# Patient Record
Sex: Female | Born: 1960 | Race: White | Hispanic: No | Smoking: Current every day smoker
Health system: Southern US, Community
[De-identification: ages and names within clinical notes are randomized; demographics above are authoritative.]

## PROBLEM LIST (undated history)

## (undated) DIAGNOSIS — M1990 Unspecified osteoarthritis, unspecified site: Secondary | ICD-10-CM

## (undated) DIAGNOSIS — Z973 Presence of spectacles and contact lenses: Secondary | ICD-10-CM

## (undated) DIAGNOSIS — I10 Essential (primary) hypertension: Secondary | ICD-10-CM

## (undated) DIAGNOSIS — F419 Anxiety disorder, unspecified: Secondary | ICD-10-CM

## (undated) DIAGNOSIS — F32A Depression, unspecified: Secondary | ICD-10-CM

## (undated) DIAGNOSIS — R112 Nausea with vomiting, unspecified: Secondary | ICD-10-CM

## (undated) DIAGNOSIS — Z720 Tobacco use: Secondary | ICD-10-CM

## (undated) DIAGNOSIS — Z800 Family history of malignant neoplasm of digestive organs: Secondary | ICD-10-CM

## (undated) DIAGNOSIS — K589 Irritable bowel syndrome without diarrhea: Secondary | ICD-10-CM

## (undated) DIAGNOSIS — R928 Other abnormal and inconclusive findings on diagnostic imaging of breast: Secondary | ICD-10-CM

## (undated) DIAGNOSIS — F411 Generalized anxiety disorder: Secondary | ICD-10-CM

## (undated) DIAGNOSIS — Z803 Family history of malignant neoplasm of breast: Secondary | ICD-10-CM

## (undated) DIAGNOSIS — R002 Palpitations: Secondary | ICD-10-CM

## (undated) DIAGNOSIS — F329 Major depressive disorder, single episode, unspecified: Secondary | ICD-10-CM

## (undated) DIAGNOSIS — N209 Urinary calculus, unspecified: Secondary | ICD-10-CM

## (undated) DIAGNOSIS — Z9889 Other specified postprocedural states: Secondary | ICD-10-CM

## (undated) DIAGNOSIS — F410 Panic disorder [episodic paroxysmal anxiety] without agoraphobia: Secondary | ICD-10-CM

## (undated) DIAGNOSIS — N189 Chronic kidney disease, unspecified: Secondary | ICD-10-CM

## (undated) HISTORY — PX: BREAST BIOPSY: SHX20

## (undated) HISTORY — DX: Depression, unspecified: F32.A

## (undated) HISTORY — DX: Other abnormal and inconclusive findings on diagnostic imaging of breast: R92.8

## (undated) HISTORY — DX: Family history of malignant neoplasm of digestive organs: Z80.0

## (undated) HISTORY — DX: Major depressive disorder, single episode, unspecified: F32.9

## (undated) HISTORY — DX: Tobacco use: Z72.0

## (undated) HISTORY — DX: Urinary calculus, unspecified: N20.9

## (undated) HISTORY — DX: Irritable bowel syndrome without diarrhea: K58.9

## (undated) HISTORY — DX: Family history of malignant neoplasm of breast: Z80.3

## (undated) HISTORY — DX: Anxiety disorder, unspecified: F41.9

## (undated) HISTORY — PX: LAPAROSCOPIC TUBAL LIGATION: SUR803

## (undated) HISTORY — DX: Essential (primary) hypertension: I10

## (undated) HISTORY — DX: Chronic kidney disease, unspecified: N18.9

## (undated) HISTORY — DX: Unspecified osteoarthritis, unspecified site: M19.90

## (undated) HISTORY — DX: Hypercalcemia: E83.52

---

## 2002-03-05 DIAGNOSIS — K589 Irritable bowel syndrome without diarrhea: Secondary | ICD-10-CM

## 2002-03-05 HISTORY — DX: Irritable bowel syndrome, unspecified: K58.9

## 2002-03-05 HISTORY — DX: Hypercalcemia: E83.52

## 2003-11-27 ENCOUNTER — Other Ambulatory Visit: Payer: Self-pay

## 2004-03-05 HISTORY — PX: CHOLECYSTECTOMY: SHX55

## 2004-03-16 ENCOUNTER — Ambulatory Visit: Payer: Self-pay | Admitting: Internal Medicine

## 2004-08-21 ENCOUNTER — Ambulatory Visit: Payer: Self-pay | Admitting: Unknown Physician Specialty

## 2005-09-13 ENCOUNTER — Ambulatory Visit: Payer: Self-pay | Admitting: Unknown Physician Specialty

## 2005-09-20 ENCOUNTER — Ambulatory Visit: Payer: Self-pay

## 2005-12-03 ENCOUNTER — Ambulatory Visit: Payer: Self-pay | Admitting: Internal Medicine

## 2005-12-27 ENCOUNTER — Ambulatory Visit: Payer: Self-pay | Admitting: General Surgery

## 2006-02-11 ENCOUNTER — Ambulatory Visit: Payer: Self-pay | Admitting: Urology

## 2006-09-02 ENCOUNTER — Ambulatory Visit: Payer: Self-pay | Admitting: Urology

## 2006-10-10 ENCOUNTER — Ambulatory Visit: Payer: Self-pay | Admitting: Gastroenterology

## 2006-10-22 ENCOUNTER — Ambulatory Visit: Payer: Self-pay | Admitting: Unknown Physician Specialty

## 2006-10-28 ENCOUNTER — Ambulatory Visit: Payer: Self-pay | Admitting: Unknown Physician Specialty

## 2006-11-19 ENCOUNTER — Ambulatory Visit: Payer: Self-pay | Admitting: General Surgery

## 2007-08-25 ENCOUNTER — Ambulatory Visit: Payer: Self-pay | Admitting: Urology

## 2007-11-11 ENCOUNTER — Ambulatory Visit: Payer: Self-pay | Admitting: Unknown Physician Specialty

## 2008-03-05 HISTORY — PX: BILATERAL SALPINGOOPHORECTOMY: SHX1223

## 2008-06-17 ENCOUNTER — Emergency Department: Payer: Self-pay | Admitting: Unknown Physician Specialty

## 2008-08-25 ENCOUNTER — Ambulatory Visit: Payer: Self-pay | Admitting: Urology

## 2008-10-13 ENCOUNTER — Ambulatory Visit: Payer: Self-pay | Admitting: Internal Medicine

## 2008-11-15 ENCOUNTER — Ambulatory Visit: Payer: Self-pay | Admitting: Unknown Physician Specialty

## 2008-11-23 ENCOUNTER — Ambulatory Visit: Payer: Self-pay | Admitting: Unknown Physician Specialty

## 2008-11-23 HISTORY — PX: LAPAROSCOPIC SUPRACERVICAL HYSTERECTOMY: SUR797

## 2008-12-16 ENCOUNTER — Ambulatory Visit: Payer: Self-pay | Admitting: Unknown Physician Specialty

## 2009-01-07 ENCOUNTER — Ambulatory Visit: Payer: Self-pay | Admitting: Internal Medicine

## 2009-12-27 ENCOUNTER — Ambulatory Visit: Payer: Self-pay | Admitting: Unknown Physician Specialty

## 2011-03-01 ENCOUNTER — Ambulatory Visit: Payer: Self-pay | Admitting: Unknown Physician Specialty

## 2011-03-06 HISTORY — PX: COLPOSCOPY: SHX161

## 2012-03-06 ENCOUNTER — Ambulatory Visit: Payer: Self-pay | Admitting: Family Medicine

## 2012-03-17 ENCOUNTER — Ambulatory Visit: Payer: Self-pay | Admitting: Family Medicine

## 2012-09-18 ENCOUNTER — Encounter: Payer: Self-pay | Admitting: *Deleted

## 2012-09-29 ENCOUNTER — Ambulatory Visit: Payer: Self-pay | Admitting: General Surgery

## 2013-07-02 ENCOUNTER — Ambulatory Visit: Payer: Self-pay | Admitting: Family Medicine

## 2013-07-08 ENCOUNTER — Ambulatory Visit: Payer: Self-pay | Admitting: Family Medicine

## 2013-08-17 ENCOUNTER — Ambulatory Visit: Payer: Self-pay | Admitting: Gastroenterology

## 2013-08-17 HISTORY — PX: COLONOSCOPY: SHX174

## 2014-01-11 ENCOUNTER — Ambulatory Visit: Payer: Self-pay | Admitting: Nurse Practitioner

## 2015-07-11 ENCOUNTER — Other Ambulatory Visit: Payer: Self-pay | Admitting: Nurse Practitioner

## 2015-07-11 DIAGNOSIS — R1312 Dysphagia, oropharyngeal phase: Secondary | ICD-10-CM

## 2015-07-15 ENCOUNTER — Ambulatory Visit
Admission: RE | Admit: 2015-07-15 | Discharge: 2015-07-15 | Disposition: A | Discharge status: Home / Self Care | Payer: 59 | Source: Ambulatory Visit | Attending: Nurse Practitioner | Admitting: Nurse Practitioner

## 2015-07-15 DIAGNOSIS — R1312 Dysphagia, oropharyngeal phase: Secondary | ICD-10-CM | POA: Insufficient documentation

## 2015-07-15 MED ORDER — IOPAMIDOL (ISOVUE-300) INJECTION 61%
75.0000 mL | Freq: Once | INTRAVENOUS | Status: AC | PRN
  Administered 2015-07-15: 75 mL via INTRAVENOUS

## 2015-07-25 ENCOUNTER — Other Ambulatory Visit: Payer: Self-pay | Admitting: Internal Medicine

## 2015-07-25 DIAGNOSIS — Z1231 Encounter for screening mammogram for malignant neoplasm of breast: Secondary | ICD-10-CM

## 2015-08-15 ENCOUNTER — Ambulatory Visit: Payer: 59

## 2015-09-07 ENCOUNTER — Other Ambulatory Visit: Payer: Self-pay | Admitting: Internal Medicine

## 2015-09-07 ENCOUNTER — Ambulatory Visit
Admission: RE | Admit: 2015-09-07 | Discharge: 2015-09-07 | Disposition: A | Discharge status: Home / Self Care | Payer: 59 | Source: Ambulatory Visit | Attending: Internal Medicine | Admitting: Internal Medicine

## 2015-09-07 DIAGNOSIS — Z1231 Encounter for screening mammogram for malignant neoplasm of breast: Secondary | ICD-10-CM | POA: Insufficient documentation

## 2016-05-29 ENCOUNTER — Ambulatory Visit (INDEPENDENT_AMBULATORY_CARE_PROVIDER_SITE_OTHER): Payer: 59 | Admitting: Certified Nurse Midwife

## 2016-05-29 ENCOUNTER — Encounter: Payer: Self-pay | Admitting: Certified Nurse Midwife

## 2016-05-29 VITALS — BP 102/62 | HR 65 | Ht 68.5 in | Wt 208.0 lb

## 2016-05-29 DIAGNOSIS — Z124 Encounter for screening for malignant neoplasm of cervix: Secondary | ICD-10-CM

## 2016-05-29 DIAGNOSIS — I10 Essential (primary) hypertension: Secondary | ICD-10-CM | POA: Insufficient documentation

## 2016-05-29 DIAGNOSIS — Z01419 Encounter for gynecological examination (general) (routine) without abnormal findings: Secondary | ICD-10-CM

## 2016-05-29 DIAGNOSIS — Z720 Tobacco use: Secondary | ICD-10-CM

## 2016-05-29 DIAGNOSIS — Z803 Family history of malignant neoplasm of breast: Secondary | ICD-10-CM | POA: Insufficient documentation

## 2016-05-29 DIAGNOSIS — Z800 Family history of malignant neoplasm of digestive organs: Secondary | ICD-10-CM | POA: Diagnosis not present

## 2016-05-29 DIAGNOSIS — F329 Major depressive disorder, single episode, unspecified: Secondary | ICD-10-CM | POA: Insufficient documentation

## 2016-05-29 DIAGNOSIS — F32A Depression, unspecified: Secondary | ICD-10-CM | POA: Insufficient documentation

## 2016-05-29 NOTE — Progress Notes (Signed)
Gynecology Annual Exam  PCP: Dr Terese Door Chief Complaint:  Chief Complaint  Patient presents with  . Gynecologic Exam    History of Present Illness: Patient is a 56 y.o. G2P2002 presents for annual exam. The patient has no gyn complaints today.  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 She has had no spotting. Hot flashes occasionally during the day usually with increased stress. Past Medical history is significant for hypertension, anxiety, depression, tonsillar stones, urinary stones, and a left breast biopsy (2015-benign). Since her last annual exam in2/23/2016, she has had no significant changes in her health. Last pap smear was 04/27/2014 and was NIL/neg. She had a ASCUS PAP with positive HRHPV Pap  in 2013. Colpo directed biopsies done-HPV effect. Last mammogram was 09/08/2015 and was negative. She has a family history of breast cancer in her MGM, mother, and paternal GM. Genetic testing has not been done. There is a family history of colon cancer in her maternal aunt. Genetic testing has not been done. She has had several colonoscopies. Her last was 08/17/2013 by Dr Allen Norris. Several hyperplastic polyps were removed. Was advised to repeat colonoscopy in 3 years.   She does smoke 1 PPD of cigarettes. Started smoking at age 67. Has stopped twice cold [place] x 2 years each time. Is not ready to quit. She drinks alcohol infrequently She does not exercise, other than cleaning. She does get adequate calcium in her diet. Her last lipid panel was done in 2017 by her PCP and it was abnormal, although improved from the previous year.     Review of Systems: Review of Systems  Constitutional: Negative for chills, fever and weight loss.  HENT: Positive for congestion, ear pain, sinus pain and sore throat.   Eyes: Negative for blurred vision and pain.  Respiratory: Negative for hemoptysis, shortness of breath and wheezing.   Cardiovascular: Positive for  palpitations. Negative for chest pain and leg swelling.  Gastrointestinal: Negative for abdominal pain, blood in stool, diarrhea, heartburn, nausea and vomiting.  Genitourinary: Negative for dysuria, frequency, hematuria and urgency.  Musculoskeletal: Positive for joint pain. Negative for back pain and myalgias.  Skin: Negative for itching and rash.  Neurological: Negative for dizziness, tingling and headaches.  Endo/Heme/Allergies: Negative for environmental allergies and polydipsia. Does not bruise/bleed easily.       Negative for hirsutism   Psychiatric/Behavioral: Negative for depression. The patient is nervous/anxious. The patient does not have insomnia.     Past Medical History:  Past Medical History:  Diagnosis Date  . Abnormal mammogram, unspecified   . Anxiety   . Arthritis   . Depression   . Family history of breast cancer   . Family history of colon cancer   . Hypercalcemia 2004  . Hypertension   . IBS (irritable bowel syndrome) 2004  . Tobacco abuse   . Urinary calculus     Past Surgical History:  Past Surgical History:  Procedure Laterality Date  . BILATERAL SALPINGOOPHORECTOMY  2010   bilateral mucinous cystadenomas/ Dr Rayford Halsted with [facility]  . BREAST BIOPSY Left    neg  . CERVIX SURGERY  2013   biopsy  . CHOLECYSTECTOMY  2006  . COLONOSCOPY  08/17/2013   small polyps  . COLPOSCOPY  2013  . LAPAROSCOPIC SUPRACERVICAL HYSTERECTOMY  2010  . LAPAROSCOPIC TUBAL LIGATION       Obstetric History: [id number]  Family History:  Family History  Problem Relation Age of Onset  .  Leukemia Cousin 3  . Breast cancer Paternal Grandmother 2  . Schizophrenia Paternal Grandmother   . Diabetes Paternal Grandmother   . Colon cancer Maternal Aunt 37  . Leukemia Maternal Grandfather 35  . Breast cancer Maternal Grandmother 80  . Breast cancer Mother 69  . Diabetes Sister   . Schizophrenia Son     Social History:  Social History   Social History  . Marital status:  Divorced    Spouse name: N/A  . Number of children: 2  . Years of education: N/A   Occupational History  . Not on file.   Social History Main Topics  . Smoking status: Current Every Day Smoker    Packs/day: 1.00    Years: 36.00    Types: Cigarettes    Start date: 41  . Smokeless tobacco: Never Used  . Alcohol use Yes     Comment: rare  . Drug use: No  . Sexual activity: No   Other Topics Concern  . Not on file   Social History Narrative  . No narrative on file    Allergies:  Allergies  Allergen Reactions  . Codeine Nausea And Vomiting    Medications: Prior to Admission medications   Medication Sig Start Date End Date Taking? Authorizing Provider  ALPRAZolam Duanne Moron) 0.5 MG tablet Take 0.5 mg by mouth at bedtime as needed for anxiety.   Yes Historical Provider, MD  atenolol (TENORMIN) 50 MG tablet Take 50 mg by mouth daily.   Yes Historical Provider, MD  hydrochlorothiazide (HYDRODIURIL) 25 MG tablet Take 25 mg by mouth daily.   Yes Historical Provider, MD  PARoxetine (PAXIL) 20 MG tablet Take 20 mg by mouth daily.   Yes Historical Provider, MD  She also takes Vitamin C, Vitamin D3 and Align  Physical Exam Vitals: Blood pressure 102/62, pulse 65, height 5' 8.5" (1.74 m), weight 94.3 kg (208 lb).  General: NAD. Pleasant, well nourished, WF HEENT: normocephalic, anicteric Thyroid: no enlargement, no palpable nodules Pulmonary: No increased work of breathing, CTAB Cardiovascular: RRR with Grade II/VI systolic murmur at aortic area Breast: Breast symmetrical, no tenderness, no palpable nodules or masses, no skin or nipple retraction present, no nipple discharge.  No axillary, infraclavicular,  or supraclavicular lymphadenopathy. Abdomen: soft, non-tender, non-distended.  No hepatomegaly or masses palpable. No evidence of hernia  Genitourinary:  External: Normal external female genitalia.  Normal urethral meatus, normal Bartholin's and Skene's glands.    Vagina:  Normal vaginal mucosa, no evidence of prolapse.    Cervix: Grossly normal in appearance, no bleeding  Uterus: surgically asent  Adnexa:  no adnexal masses, NT  Rectal: deferred  Lymphatic: no evidence of inguinal lymphadenopathy Extremities: no edema, erythema, or tenderness Neurologic: Grossly intact Psychiatric: mood appropriate, affect full     Assessment: 56 y.o. [id number] well woman exam.  History of abnormal Pap smears Family history of breast and colon cancer   Plan:   1) Mammogram  - recommend yearly screening mammogram- patient to schedule at [facility] after 09/07/2016 -offered MYRISK testing-declined  2) Cervical cancer screening-Pap smear done  3) Osteoporosis prevention- discussed calcium and vitamin D3 requirements/ Recommend vitamin D3 supplements/  4) Routine healthcare maintenance including cholesterol, diabetes screening per her PCP  5) Colonoscopy -due this year per Dr Dorothey Baseman notes. Offered MYRISK screen due to family history of colon cancer <50 yo. Patient declines  6) Follow up 1 year for routine annual   Dalia Heading, [place]

## 2016-06-01 LAB — IGP, APTIMA HPV
HPV Aptima: NEGATIVE
PAP SMEAR COMMENT: 0

## 2016-06-06 ENCOUNTER — Telehealth: Payer: Self-pay | Admitting: Gastroenterology

## 2016-06-06 NOTE — Telephone Encounter (Signed)
Patient thinks she was told to have a repeat colonoscopy in 3 years. Can you check and call her back please.

## 2016-06-06 NOTE — Telephone Encounter (Signed)
Left vm letting pt know her last colonoscopy was 08/17/13. Per Dr. Dorothey Baseman recommendation, due to hx of colon polyps, she is to repeat in 5 years. Next colon due 08/18/2018.

## 2016-07-31 ENCOUNTER — Other Ambulatory Visit: Payer: Self-pay | Admitting: Internal Medicine

## 2016-07-31 DIAGNOSIS — Z1231 Encounter for screening mammogram for malignant neoplasm of breast: Secondary | ICD-10-CM

## 2016-08-29 DIAGNOSIS — N200 Calculus of kidney: Secondary | ICD-10-CM | POA: Insufficient documentation

## 2016-08-29 DIAGNOSIS — R3129 Other microscopic hematuria: Secondary | ICD-10-CM | POA: Insufficient documentation

## 2016-08-29 DIAGNOSIS — R82994 Hypercalciuria: Secondary | ICD-10-CM | POA: Insufficient documentation

## 2016-08-29 DIAGNOSIS — N23 Unspecified renal colic: Secondary | ICD-10-CM | POA: Insufficient documentation

## 2016-09-04 ENCOUNTER — Ambulatory Visit: Payer: 59 | Admitting: Gastroenterology

## 2016-09-04 ENCOUNTER — Other Ambulatory Visit: Payer: Self-pay

## 2016-09-10 ENCOUNTER — Ambulatory Visit
Admission: RE | Admit: 2016-09-10 | Discharge: 2016-09-10 | Disposition: A | Discharge status: Home / Self Care | Payer: 59 | Source: Ambulatory Visit | Attending: Internal Medicine | Admitting: Internal Medicine

## 2016-09-10 DIAGNOSIS — Z1231 Encounter for screening mammogram for malignant neoplasm of breast: Secondary | ICD-10-CM

## 2016-11-06 ENCOUNTER — Ambulatory Visit: Payer: 59 | Admitting: Gastroenterology

## 2016-12-04 ENCOUNTER — Ambulatory Visit (INDEPENDENT_AMBULATORY_CARE_PROVIDER_SITE_OTHER): Payer: 59 | Admitting: Gastroenterology

## 2016-12-04 ENCOUNTER — Encounter: Payer: Self-pay | Admitting: Gastroenterology

## 2016-12-04 VITALS — BP 123/85 | HR 74 | Temp 98.9°F | Ht 68.0 in | Wt 205.6 lb

## 2016-12-04 DIAGNOSIS — K580 Irritable bowel syndrome with diarrhea: Secondary | ICD-10-CM | POA: Diagnosis not present

## 2016-12-04 DIAGNOSIS — K529 Noninfective gastroenteritis and colitis, unspecified: Secondary | ICD-10-CM

## 2016-12-04 MED ORDER — AMITRIPTYLINE HCL 25 MG PO TABS: 25.0000 mg | ORAL_TABLET | Freq: Every day | ORAL | 1 refills | Status: DC

## 2016-12-04 NOTE — Progress Notes (Signed)
Shari Darby, MD [address]  [place]  [place], [place] [address]  Main: [phone]  Fax: [phone]    Gastroenterology Consultation  Referring Provider:     Perrin Maltese, MD Primary Care Physician:  Perrin Maltese, MD Primary Gastroenterologist:  Dr. Cephas Foster Reason for Consultation:   IBS        HPI:   Shari Foster is a 56 y.o. y/o female referred by Dr. Perrin Maltese, MD  for consultation & management of IBS Has long standing IBS, variable stool consistency on any given day, abdominal cramps, nausea, sometimes without BM for 2-3 days, but has cramps and gas.Tried miralax as needed which helps to have regular BM, align helped temporarily. She sometimes has episodes of nausea and vomiting  Also, has psychological disorders, was taking xanax prescribed by PCP but since it was stopped by her PCP her anxiety got worse and IBS symptoms are also worse. She says that she cannot afford to see a psychiatrist. In order to see a psychologist, she has to attend therapy sessions 2 weeks and she does not have 2-3 hours per week to attend sessions due to her work. She denies any rectal bleeding, hematemesis, melena, weight loss. Avoiding lactose products but still not helping  Mother's sister died from colon cancer in late 60s, early 1s age Mother died from breast cancer GI Procedures: last colonoscopy 5 years ago,   Past Medical History:  Diagnosis Date  . Abnormal mammogram, unspecified   . Anxiety   . Arthritis   . Depression   . Family history of breast cancer   . Family history of colon cancer   . Hypercalcemia 2004  . Hypertension   . IBS (irritable bowel syndrome) 2004  . Tobacco abuse   . Urinary calculus     Past Surgical History:  Procedure Laterality Date  . BILATERAL SALPINGOOPHORECTOMY  2010   bilateral mucinous cystadenomas/ Dr Rayford Halsted with [facility]  . BREAST BIOPSY Left    neg  . CERVIX SURGERY  2013   biopsy  . CHOLECYSTECTOMY  2006  .  COLONOSCOPY  08/17/2013   small polyps  . COLPOSCOPY  2013  . LAPAROSCOPIC SUPRACERVICAL HYSTERECTOMY  2010  . LAPAROSCOPIC TUBAL LIGATION      Prior to Admission medications   Medication Sig Start Date End Date Taking? Authorizing Provider  ALPRAZolam Duanne Moron) 0.5 MG tablet Take 0.5 mg by mouth at bedtime as needed for anxiety.    [provider]  Ascorbic Acid (VITAMIN C) 1000 MG tablet Take 1,000 mg by mouth daily.    [provider]  atenolol (TENORMIN) 50 MG tablet Take 50 mg by mouth daily.    [provider]  Cholecalciferol 1000 units capsule Take 1,000 Units by mouth daily.    [provider]  hydrochlorothiazide (HYDRODIURIL) 25 MG tablet Take 25 mg by mouth daily.    [provider]  PARoxetine (PAXIL) 20 MG tablet Take 20 mg by mouth daily.    [provider]  Probiotic Product (PROBIOTIC-10) CAPS Take 1 capsule by mouth daily.    [provider]    Family History  Problem Relation Age of Onset  . Leukemia Cousin 63  . Breast cancer Paternal Grandmother 25  . Schizophrenia Paternal Grandmother   . Diabetes Paternal Grandmother   . Colon cancer Maternal Aunt 24  . Leukemia Maternal Grandfather 57  . Breast cancer Maternal Grandmother 80  . Breast cancer Mother  34  . Diabetes Sister   . Schizophrenia Son      Social History  Substance Use Topics  . Smoking status: Current Every Day Smoker    Packs/day: 1.00    Years: 36.00    Types: Cigarettes    Start date: 75  . Smokeless tobacco: Never Used  . Alcohol use No     Comment: rare    Allergies as of 12/04/2016 - Review Complete 12/04/2016  Allergen Reaction Noted  . Codeine Nausea And Vomiting 05/29/2016    Review of Systems:    All systems reviewed and negative except where noted in HPI.   Physical Exam:  BP 123/85   Pulse 74   Temp 98.9 F (37.2 C)   Ht 5\' 8"  (1.727 m)   Wt 205 lb 9.6 oz (93.3 kg)   BMI 31.26 kg/m  No LMP recorded.  Patient has had a hysterectomy.  General:   Alert,  Well-developed, well-nourished, pleasant and cooperative in NAD Head:  Normocephalic and atraumatic. Eyes:  Sclera clear, no icterus.   Conjunctiva pink. Ears:  Normal auditory acuity. Nose:  No deformity, discharge, or lesions. Mouth:  No deformity or lesions,oropharynx pink & moist. Neck:  Supple; no masses or thyromegaly. Lungs:  Respirations even and unlabored.  Clear throughout to auscultation.   No wheezes, crackles, or rhonchi. No acute distress. Heart:  Regular rate and rhythm; no murmurs, clicks, rubs, or gallops. Abdomen:  Normal bowel sounds.  No bruits.  Soft, mild lower abdominal tenderness and non-distended without masses, hepatosplenomegaly or hernias noted.  No guarding or rebound tenderness.   Rectal: Nor performed Msk:  Symmetrical without gross deformities. Good, equal movement & strength bilaterally. Pulses:  Normal pulses noted. Extremities:  No clubbing or edema.  No cyanosis. Neurologic:  Alert and oriented x3;  grossly normal neurologically. Skin:  Intact without significant lesions or rashes. No jaundice. Psych:  Alert and cooperative. Normal mood and affect.  Imaging Studies: No abdominal imaging available  Assessment and Plan:   Shari Foster is a 56 y.o. female with Anxiety, IBS-D presents with 1 month exacerbation of IBS-D in the setting of poorly controlled anxiety disorder. Trial of amitriptyline 25 mg at bedtime, will give samples for IB-guard, low FODMAPs diet. She did not tolerate desipramine in the past when it was given to treat her depression Colonoscopy given family history of colon cancer Check routine labs Stool for C. Difficile  Follow up in 4 weeks   Shari Darby, MD

## 2016-12-06 ENCOUNTER — Other Ambulatory Visit: Payer: Self-pay

## 2016-12-06 DIAGNOSIS — Z800 Family history of malignant neoplasm of digestive organs: Secondary | ICD-10-CM

## 2016-12-18 ENCOUNTER — Encounter: Payer: Self-pay | Admitting: *Deleted

## 2016-12-24 NOTE — Discharge Instructions (Signed)
General Anesthesia, Adult, Care After °These instructions provide you with information about caring for yourself after your procedure. Your health care provider may also give you more specific instructions. Your treatment has been planned according to current medical practices, but problems sometimes occur. Call your health care provider if you have any problems or questions after your procedure. °What can I expect after the procedure? °After the procedure, it is common to have: °· Vomiting. °· A sore throat. °· Mental slowness. ° °It is common to feel: °· Nauseous. °· Cold or shivery. °· Sleepy. °· Tired. °· Sore or achy, even in parts of your body where you did not have surgery. ° °Follow these instructions at home: °For at least 24 hours after the procedure: °· Do not: °? Participate in activities where you could fall or become injured. °? Drive. °? Use heavy machinery. °? Drink alcohol. °? Take sleeping pills or medicines that cause drowsiness. °? Make important decisions or sign legal documents. °? Take care of children on your own. °· Rest. °Eating and drinking °· If you vomit, drink water, juice, or soup when you can drink without vomiting. °· Drink enough fluid to keep your urine clear or pale yellow. °· Make sure you have little or no nausea before eating solid foods. °· Follow the diet recommended by your health care provider. °General instructions °· Have a responsible adult stay with you until you are awake and alert. °· Return to your normal activities as told by your health care provider. Ask your health care provider what activities are safe for you. °· Take over-the-counter and prescription medicines only as told by your health care provider. °· If you smoke, do not smoke without supervision. °· Keep all follow-up visits as told by your health care provider. This is important. °Contact a health care provider if: °· You continue to have nausea or vomiting at home, and medicines are not helpful. °· You  cannot drink fluids or start eating again. °· You cannot urinate after 8-12 hours. °· You develop a skin rash. °· You have fever. °· You have increasing redness at the site of your procedure. °Get help right away if: °· You have difficulty breathing. °· You have chest pain. °· You have unexpected bleeding. °· You feel that you are having a life-threatening or urgent problem. °This information is not intended to replace advice given to you by your health care provider. Make sure you discuss any questions you have with your health care provider. °Document Released: 05/28/2000 Document Revised: 07/25/2015 Document Reviewed: 02/03/2015 °Elsevier Interactive Patient Education © 2018 [employer] Inc[employer] ° °

## 2016-12-26 ENCOUNTER — Ambulatory Visit
Admission: RE | Admit: 2016-12-26 | Discharge: 2016-12-26 | Disposition: A | Discharge status: Home / Self Care | Payer: 59 | Source: Ambulatory Visit | Attending: Gastroenterology | Admitting: Gastroenterology

## 2016-12-26 ENCOUNTER — Encounter
Admission: RE | Disposition: A | Discharge status: Home / Self Care | Payer: Self-pay | Source: Ambulatory Visit | Attending: Gastroenterology

## 2016-12-26 ENCOUNTER — Ambulatory Visit: Payer: 59 | Admitting: Anesthesiology

## 2016-12-26 DIAGNOSIS — K635 Polyp of colon: Secondary | ICD-10-CM | POA: Diagnosis not present

## 2016-12-26 DIAGNOSIS — Z888 Allergy status to other drugs, medicaments and biological substances status: Secondary | ICD-10-CM | POA: Diagnosis not present

## 2016-12-26 DIAGNOSIS — K648 Other hemorrhoids: Secondary | ICD-10-CM | POA: Diagnosis not present

## 2016-12-26 DIAGNOSIS — K589 Irritable bowel syndrome without diarrhea: Secondary | ICD-10-CM | POA: Insufficient documentation

## 2016-12-26 DIAGNOSIS — Z79899 Other long term (current) drug therapy: Secondary | ICD-10-CM | POA: Diagnosis not present

## 2016-12-26 DIAGNOSIS — M1990 Unspecified osteoarthritis, unspecified site: Secondary | ICD-10-CM | POA: Insufficient documentation

## 2016-12-26 DIAGNOSIS — F419 Anxiety disorder, unspecified: Secondary | ICD-10-CM | POA: Insufficient documentation

## 2016-12-26 DIAGNOSIS — F329 Major depressive disorder, single episode, unspecified: Secondary | ICD-10-CM | POA: Diagnosis not present

## 2016-12-26 DIAGNOSIS — Z1211 Encounter for screening for malignant neoplasm of colon: Secondary | ICD-10-CM | POA: Diagnosis not present

## 2016-12-26 DIAGNOSIS — I10 Essential (primary) hypertension: Secondary | ICD-10-CM | POA: Diagnosis not present

## 2016-12-26 DIAGNOSIS — F17210 Nicotine dependence, cigarettes, uncomplicated: Secondary | ICD-10-CM | POA: Diagnosis not present

## 2016-12-26 DIAGNOSIS — D125 Benign neoplasm of sigmoid colon: Secondary | ICD-10-CM

## 2016-12-26 DIAGNOSIS — Z9189 Other specified personal risk factors, not elsewhere classified: Secondary | ICD-10-CM

## 2016-12-26 DIAGNOSIS — D122 Benign neoplasm of ascending colon: Secondary | ICD-10-CM

## 2016-12-26 DIAGNOSIS — D120 Benign neoplasm of cecum: Secondary | ICD-10-CM

## 2016-12-26 HISTORY — PX: COLONOSCOPY WITH PROPOFOL: SHX5780

## 2016-12-26 HISTORY — DX: Other specified postprocedural states: Z98.890

## 2016-12-26 HISTORY — DX: Presence of spectacles and contact lenses: Z97.3

## 2016-12-26 HISTORY — DX: Palpitations: R00.2

## 2016-12-26 HISTORY — DX: Nausea with vomiting, unspecified: R11.2

## 2016-12-26 SURGERY — COLONOSCOPY WITH PROPOFOL
Anesthesia: General | Wound class: Clean Contaminated

## 2016-12-26 MED ORDER — ACETAMINOPHEN 160 MG/5ML PO SOLN: 325.0000 mg | Freq: Once | ORAL | Status: AC

## 2016-12-26 MED ORDER — PROPOFOL 10 MG/ML IV BOLUS
INTRAVENOUS | Status: DC | PRN
  Administered 2016-12-26: 50 mg via INTRAVENOUS
  Administered 2016-12-26: 100 mg via INTRAVENOUS

## 2016-12-26 MED ORDER — ACETAMINOPHEN 325 MG PO TABS
325.0000 mg | ORAL_TABLET | Freq: Once | ORAL | Status: AC
  Administered 2016-12-26: 325 mg via ORAL

## 2016-12-26 MED ORDER — PROPOFOL 500 MG/50ML IV EMUL
INTRAVENOUS | Status: DC | PRN
  Administered 2016-12-26: 150 ug/kg/min via INTRAVENOUS

## 2016-12-26 MED ORDER — LACTATED RINGERS IV SOLN
INTRAVENOUS | Status: DC
  Administered 2016-12-26: 09:00:00 via INTRAVENOUS

## 2016-12-26 SURGICAL SUPPLY — 23 items
CANISTER SUCT 1200ML W/VALVE (MISCELLANEOUS) ×3 IMPLANT
CLIP HMST 235XBRD CATH ROT (MISCELLANEOUS) IMPLANT
CLIP RESOLUTION 360 11X235 (MISCELLANEOUS)
FCP ESCP3.2XJMB 240X2.8X (MISCELLANEOUS)
FORCEPS BIOP RAD 4 LRG CAP 4 (CUTTING FORCEPS) ×3 IMPLANT
FORCEPS BIOP RJ4 240 W/NDL (MISCELLANEOUS)
FORCEPS ESCP3.2XJMB 240X2.8X (MISCELLANEOUS) IMPLANT
GOWN CVR UNV OPN BCK APRN NK (MISCELLANEOUS) ×2 IMPLANT
GOWN ISOL THUMB LOOP REG UNIV (MISCELLANEOUS) ×4
INJECTOR VARIJECT VIN23 (MISCELLANEOUS) IMPLANT
KIT DEFENDO VALVE AND CONN (KITS) IMPLANT
KIT ENDO PROCEDURE OLY (KITS) ×3 IMPLANT
MARKER SPOT ENDO TATTOO 5ML (MISCELLANEOUS) IMPLANT
PAD GROUND ADULT SPLIT (MISCELLANEOUS) IMPLANT
PROBE APC STR FIRE (PROBE) IMPLANT
RETRIEVER NET ROTH 2.5X230 LF (MISCELLANEOUS) IMPLANT
SNARE SHORT THROW 13M SML OVAL (MISCELLANEOUS) ×3 IMPLANT
SNARE SHORT THROW 30M LRG OVAL (MISCELLANEOUS) IMPLANT
SNARE SNG USE RND 15MM (INSTRUMENTS) IMPLANT
SPOT EX ENDOSCOPIC TATTOO (MISCELLANEOUS)
TRAP ETRAP POLY (MISCELLANEOUS) ×3 IMPLANT
VARIJECT INJECTOR VIN23 (MISCELLANEOUS)
WATER STERILE IRR 250ML POUR (IV SOLUTION) ×3 IMPLANT

## 2016-12-26 NOTE — H&P (Signed)
Cephas Darby, MD [address]  [place], [place] [address]  Main: [phone]  Fax: [phone] Pager: [phone]  Primary Care Physician:  Patient, No Pcp Per Primary Gastroenterologist:  Dr. Cephas Darby  Pre-Procedure History & Physical: HPI:  Shari Foster is a 56 y.o. female is here for an colonoscopy.   Past Medical History:  Diagnosis Date  . Abnormal mammogram, unspecified   . Anxiety   . Arthritis   . Depression   . Family history of breast cancer   . Family history of colon cancer   . Hypercalcemia 2004  . Hypertension    (pt denies)  . IBS (irritable bowel syndrome) 2004  . Palpitations   . PONV (postoperative nausea and vomiting)   . Tobacco abuse   . Urinary calculus   . Wears contact lenses     Past Surgical History:  Procedure Laterality Date  . BILATERAL SALPINGOOPHORECTOMY  2010   bilateral mucinous cystadenomas/ Dr Rayford Halsted with [facility]  . BREAST BIOPSY Left    neg  . CERVIX SURGERY  2013   biopsy  . CHOLECYSTECTOMY  2006  . COLONOSCOPY  08/17/2013   small polyps  . COLPOSCOPY  2013  . LAPAROSCOPIC SUPRACERVICAL HYSTERECTOMY  2010  . LAPAROSCOPIC TUBAL LIGATION      Prior to Admission medications   Medication Sig Start Date End Date Taking? Authorizing Provider  atenolol (TENORMIN) 50 MG tablet Take 50 mg by mouth daily.   Yes [provider]  hydrochlorothiazide (HYDRODIURIL) 25 MG tablet Take 25 mg by mouth daily.   Yes [provider]  PARoxetine (PAXIL) 20 MG tablet Take 20 mg by mouth daily.   Yes [provider]  amitriptyline (ELAVIL) 25 MG tablet Take 1 tablet (25 mg total) by mouth at bedtime. Patient not taking: Reported on 12/26/2016 12/04/16 01/03/17  Lin Landsman, MD    Allergies as of 12/06/2016 - Review Complete 12/04/2016  Allergen Reaction Noted  . Codeine Nausea And Vomiting 05/29/2016    Family History  Problem Relation Age of Onset  . Leukemia Cousin 84  . Breast  cancer Paternal Grandmother 52  . Schizophrenia Paternal Grandmother   . Diabetes Paternal Grandmother   . Colon cancer Maternal Aunt 84  . Leukemia Maternal Grandfather 37  . Breast cancer Maternal Grandmother 80  . Breast cancer Mother 8  . Diabetes Sister   . Schizophrenia Son     Social History   Social History  . Marital status: Divorced    Spouse name: N/A  . Number of children: 2  . Years of education: N/A   Occupational History  . Not on file.   Social History Main Topics  . Smoking status: Current Every Day Smoker    Packs/day: 1.00    Years: 36.00    Types: Cigarettes    Start date: 50  . Smokeless tobacco: Never Used     Comment: off and on since age 19  . Alcohol use No     Comment: rare  . Drug use: No  . Sexual activity: No   Other Topics Concern  . Not on file   Social History Narrative  . No narrative on file    Review of Systems: See HPI, otherwise negative ROS  Physical Exam: BP 103/70   Pulse 89   Resp 16   Ht 5\' 8"  (1.727 m)   Wt 202 lb (91.6 kg)   SpO2 94%   BMI 30.71  kg/m  General:   Alert,  pleasant and cooperative in NAD Head:  Normocephalic and atraumatic. Neck:  Supple; no masses or thyromegaly. Lungs:  Clear throughout to auscultation.    Heart:  Regular rate and rhythm. Abdomen:  Soft, nontender and nondistended. Normal bowel sounds, without guarding, and without rebound.   Neurologic:  Alert and  oriented x4;  grossly normal neurologically.  Impression/Plan: Shari Foster is here for an colonoscopy to be performed for colon cancer surveillance  Risks, benefits, limitations, and alternatives regarding  colonoscopy have been reviewed with the patient.  Questions have been answered.  All parties agreeable.   Sherri Sear, MD  12/26/2016, 8:36 AM

## 2016-12-26 NOTE — Anesthesia Procedure Notes (Signed)
Date/Time: 12/26/2016 9:49 AM Performed by: Cameron Ali Pre-anesthesia Checklist: Patient identified, Emergency Drugs available, Suction available, Timeout performed and Patient being monitored Patient Re-evaluated:Patient Re-evaluated prior to induction Oxygen Delivery Method: Nasal cannula Placement Confirmation: positive ETCO2

## 2016-12-26 NOTE — Anesthesia Postprocedure Evaluation (Signed)
Anesthesia Post Note  Patient: Shari Foster  Procedure(s) Performed: COLONOSCOPY WITH PROPOFOL (N/A )  Patient location during evaluation: PACU Anesthesia Type: General Level of consciousness: awake and alert and oriented Pain management: satisfactory to patient Vital Signs Assessment: post-procedure vital signs reviewed and stable Respiratory status: spontaneous breathing, nonlabored ventilation and respiratory function stable Cardiovascular status: blood pressure returned to baseline and stable Postop Assessment: Adequate PO intake and No signs of nausea or vomiting Anesthetic complications: no    Raliegh Ip

## 2016-12-26 NOTE — Anesthesia Preprocedure Evaluation (Signed)
Anesthesia Evaluation  Patient identified by MRN, date of birth, ID band Patient awake    Reviewed: Allergy & Precautions, H&P , NPO status , Patient's Chart, lab work & pertinent test results  Airway Mallampati: II  TM Distance: >3 FB Neck ROM: full    Dental no notable dental hx.    Pulmonary Current Smoker,    Pulmonary exam normal breath sounds clear to auscultation       Cardiovascular hypertension, Normal cardiovascular exam Rhythm:regular Rate:Normal     Neuro/Psych PSYCHIATRIC DISORDERS    GI/Hepatic   Endo/Other    Renal/GU      Musculoskeletal   Abdominal   Peds  Hematology   Anesthesia Other Findings   Reproductive/Obstetrics                             Anesthesia Physical Anesthesia Plan  ASA: II  Anesthesia Plan: General   Post-op Pain Management:    Induction:   PONV Risk Score and Plan: 3 and Propofol infusion  Airway Management Planned:   Additional Equipment:   Intra-op Plan:   Post-operative Plan:   Informed Consent: I have reviewed the patients History and Physical, chart, labs and discussed the procedure including the risks, benefits and alternatives for the proposed anesthesia with the patient or authorized representative who has indicated his/her understanding and acceptance.     Plan Discussed with: CRNA  Anesthesia Plan Comments:         Anesthesia Quick Evaluation

## 2016-12-26 NOTE — Transfer of Care (Signed)
Immediate Anesthesia Transfer of Care Note  Patient: Shari Foster  Procedure(s) Performed: COLONOSCOPY WITH PROPOFOL (N/A )  Patient Location: PACU  Anesthesia Type: General  Level of Consciousness: awake, alert  and patient cooperative  Airway and Oxygen Therapy: Patient Spontanous Breathing and Patient connected to supplemental oxygen  Post-op Assessment: Post-op Vital signs reviewed, Patient's Cardiovascular Status Stable, Respiratory Function Stable, Patent Airway and No signs of Nausea or vomiting  Post-op Vital Signs: Reviewed and stable  Complications: No apparent anesthesia complications

## 2016-12-26 NOTE — Op Note (Signed)
[facility] Gastroenterology Patient Name: Shari Foster Procedure Date: 12/26/2016 9:43 AM MRN: [id number] Account #: [id number] Date of Birth: [birth year] Admit Type: Outpatient Age: 56 Room: [facility] OR ROOM 01 Gender: Female Note Status: Finalized Procedure:            Colonoscopy Indications:          Screening in patient at increased risk: Colorectal                        cancer in sister before age 20, Last colonoscopy: June                        2015 Providers:            Lin Landsman MD, MD Referring MD:         Perrin Maltese, MD (Referring MD) Medicines:            Monitored Anesthesia Care Complications:        No immediate complications. Estimated blood loss:                        Minimal. Procedure:            Pre-Anesthesia Assessment:                       - Prior to the procedure, a History and Physical was                        performed, and patient medications and allergies were                        reviewed. The patient is competent. The risks and                        benefits of the procedure and the sedation options and                        risks were discussed with the patient. All questions                        were answered and informed consent was obtained.                        Patient identification and proposed procedure were                        verified by the physician, the nurse, the                        anesthesiologist, the anesthetist and the technician in                        the pre-procedure area in the procedure room. Mental                        Status Examination: alert and oriented. Airway                        Examination: normal oropharyngeal airway and neck  mobility. Respiratory Examination: clear to                        auscultation. CV Examination: normal. Prophylactic                        Antibiotics: The patient does not require prophylactic                         antibiotics. Prior Anticoagulants: The patient has                        taken no previous anticoagulant or antiplatelet agents.                        ASA Grade Assessment: II - A patient with mild systemic                        disease. After reviewing the risks and benefits, the                        patient was deemed in satisfactory condition to undergo                        the procedure. The anesthesia plan was to use monitored                        anesthesia care (MAC). Immediately prior to                        administration of medications, the patient was                        re-assessed for adequacy to receive sedatives. The                        heart rate, respiratory rate, oxygen saturations, blood                        pressure, adequacy of pulmonary ventilation, and                        response to care were monitored throughout the                        procedure. The physical status of the patient was                        re-assessed after the procedure.                       After obtaining informed consent, the colonoscope was                        passed under direct vision. Throughout the procedure,                        the patient's blood pressure, pulse, and oxygen                        saturations were monitored  continuously. The Catarina (S#: [id number]) was introduced through                        the anus and advanced to the the terminal ileum. The                        colonoscopy was technically difficult and complex due                        to a tortuous colon and the patient's body habitus.                        Successful completion of the procedure was aided by                        applying abdominal pressure. The patient tolerated the                        procedure well. The quality of the bowel preparation                        was evaluated using the BBPS [facility] Bowel Preparation                         Scale) with scores of: Right Colon = 3, Transverse                        Colon = 3 and Left Colon = 3 (entire mucosa seen well                        with no residual staining, small fragments of stool or                        opaque liquid). The total BBPS score equals 9. Findings:      The perianal and digital rectal examinations were normal. Pertinent       negatives include normal sphincter tone and no palpable rectal lesions.      A 4 mm polyp was found in the cecum. The polyp was sessile. The polyp       was removed with a cold biopsy forceps. Resection and retrieval were       complete.      Two flat polyps were found in the ascending colon. The polyps were 6 to       8 mm in size. These polyps were removed with a hot snare. Resection and       retrieval were complete.      A 5 mm polyp was found in the ascending colon. The polyp was flat. The       polyp was removed with a cold biopsy forceps. Resection and retrieval       were complete.      A diminutive polyp was found in the sigmoid colon. The polyp was       sessile. The polyp was removed with a cold biopsy forceps. Resection and       retrieval were complete.  Non-bleeding internal hemorrhoids were found during retroflexion. The       hemorrhoids were medium-sized. Impression:           - One 4 mm polyp in the cecum, removed with a cold                        biopsy forceps. Resected and retrieved.                       - Two 6 to 8 mm polyps in the ascending colon, removed                        with a hot snare. Resected and retrieved.                       - One 5 mm polyp in the ascending colon, removed with a                        cold biopsy forceps. Resected and retrieved.                       - One diminutive polyp in the sigmoid colon, removed                        with a cold biopsy forceps. Resected and retrieved.                       - Non-bleeding internal  hemorrhoids. Recommendation:       - Repeat colonoscopy in 3 years for surveillance based                        on pathology results.                       - Await pathology results.                       - Discharge patient to home.                       - Resume previous diet today.                       - Continue present medications. Procedure Code(s):    --- Professional ---                       [id number], Colonoscopy, flexible; with removal of tumor(s),                        polyp(s), or other lesion(s) by snare technique                       45380, 5, Colonoscopy, flexible; with biopsy, single                        or multiple Diagnosis Code(s):    --- Professional ---                       Z80.0, Family history of malignant neoplasm of  digestive organs                       D12.0, Benign neoplasm of cecum                       D12.2, Benign neoplasm of ascending colon                       D12.5, Benign neoplasm of sigmoid colon                       K64.8, Other hemorrhoids CPT copyright 2016 American Medical Association. All rights reserved. The codes documented in this report are preliminary and upon coder review may  be revised to meet current compliance requirements. Dr. Ulyess Mort Lin Landsman MD, MD 12/26/2016 10:38:18 AM This report has been signed electronically. Number of Addenda: 0 Note Initiated On: 12/26/2016 9:43 AM Scope Withdrawal Time: 0 hours 33 minutes 2 seconds  Total Procedure Duration: 0 hours 36 minutes 50 seconds       [facility]

## 2016-12-27 ENCOUNTER — Encounter: Payer: Self-pay | Admitting: Gastroenterology

## 2016-12-28 ENCOUNTER — Encounter: Payer: Self-pay | Admitting: Gastroenterology

## 2017-01-01 ENCOUNTER — Encounter: Payer: Self-pay | Admitting: Gastroenterology

## 2017-01-03 ENCOUNTER — Encounter: Payer: Self-pay | Admitting: Gastroenterology

## 2017-05-07 ENCOUNTER — Encounter: Payer: Self-pay | Admitting: Psychiatry

## 2017-05-07 ENCOUNTER — Ambulatory Visit (INDEPENDENT_AMBULATORY_CARE_PROVIDER_SITE_OTHER): Payer: 59 | Admitting: Psychiatry

## 2017-05-07 ENCOUNTER — Other Ambulatory Visit: Payer: Self-pay

## 2017-05-07 VITALS — BP 109/70 | HR 79 | Temp 98.4°F | Wt 211.0 lb

## 2017-05-07 DIAGNOSIS — F17200 Nicotine dependence, unspecified, uncomplicated: Secondary | ICD-10-CM

## 2017-05-07 DIAGNOSIS — F411 Generalized anxiety disorder: Secondary | ICD-10-CM | POA: Diagnosis not present

## 2017-05-07 DIAGNOSIS — F330 Major depressive disorder, recurrent, mild: Secondary | ICD-10-CM

## 2017-05-07 MED ORDER — PAROXETINE HCL 30 MG PO TABS: 60.0000 mg | ORAL_TABLET | Freq: Every day | ORAL | 1 refills | Status: DC

## 2017-05-07 MED ORDER — HYDROXYZINE PAMOATE 25 MG PO CAPS: 25.0000 mg | ORAL_CAPSULE | Freq: Two times a day (BID) | ORAL | 1 refills | Status: DC | PRN
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
## 2017-05-07 NOTE — Patient Instructions (Signed)
Hydroxyzine capsules or tablets What is this medicine? HYDROXYZINE (hye [place] i zeen) is an antihistamine. This medicine is used to treat allergy symptoms. It is also used to treat anxiety and tension. This medicine can be used with other medicines to induce sleep before surgery. This medicine may be used for other purposes; ask your health care provider or pharmacist if you have questions. COMMON BRAND NAME(S): ANX, Atarax, Rezine, Vistaril What should I tell my health care provider before I take this medicine? They need to know if you have any of these conditions: -any chronic illness -difficulty passing urine -glaucoma -heart disease -kidney disease -liver disease -lung disease -an unusual or allergic reaction to hydroxyzine, cetirizine, other medicines, foods, dyes, or preservatives -pregnant or trying to get pregnant -breast-feeding How should I use this medicine? Take this medicine by mouth with a full glass of water. Follow the directions on the prescription label. You may take this medicine with food or on an empty stomach. Take your medicine at regular intervals. Do not take your medicine more often than directed. Talk to your pediatrician regarding the use of this medicine in children. Special care may be needed. While this drug may be prescribed for children as young as 75 years of age for selected conditions, precautions do apply. Patients over 62 years old may have a stronger reaction and need a smaller dose. Overdosage: If you think you have taken too much of this medicine contact a poison control center or emergency room at once. NOTE: This medicine is only for you. Do not share this medicine with others. What if I miss a dose? If you miss a dose, take it as soon as you can. If it is almost time for your next dose, take only that dose. Do not take double or extra doses. What may interact with this medicine? -alcohol -barbiturate medicines for sleep or seizures -medicines for  colds, allergies -medicines for depression, anxiety, or emotional disturbances -medicines for pain -medicines for sleep -muscle relaxants This list may not describe all possible interactions. Give your health care provider a list of all the medicines, herbs, non-prescription drugs, or dietary supplements you use. Also tell them if you smoke, drink alcohol, or use illegal drugs. Some items may interact with your medicine. What should I watch for while using this medicine? Tell your doctor or health care professional if your symptoms do not improve. You may get drowsy or dizzy. Do not drive, use machinery, or do anything that needs mental alertness until you know how this medicine affects you. Do not stand or sit up quickly, especially if you are an older patient. This reduces the risk of dizzy or fainting spells. Alcohol may interfere with the effect of this medicine. Avoid alcoholic drinks. Your mouth may get dry. Chewing sugarless gum or sucking hard candy, and drinking plenty of water may help. Contact your doctor if the problem does not go away or is severe. This medicine may cause dry eyes and blurred vision. If you wear contact lenses you may feel some discomfort. Lubricating drops may help. See your eye doctor if the problem does not go away or is severe. If you are receiving skin tests for allergies, tell your doctor you are using this medicine. What side effects may I notice from receiving this medicine? Side effects that you should report to your doctor or health care professional as soon as possible: -fast or irregular heartbeat -difficulty passing urine -seizures -slurred speech or confusion -tremor Side effects that  usually do not require medical attention (report to your doctor or health care professional if they continue or are bothersome): -constipation -drowsiness -fatigue -headache -stomach upset This list may not describe all possible side effects. Call your doctor for  medical advice about side effects. You may report side effects to FDA at 1-800-FDA-1088. Where should I keep my medicine? Keep out of the reach of children. Store at room temperature between 15 and 30 degrees C (59 and 86 degrees F). Keep container tightly closed. Throw away any unused medicine after the expiration date. NOTE: This sheet is a summary. It may not cover all possible information. If you have questions about this medicine, talk to your doctor, pharmacist, or health care provider.  2018 Elsevier/Gold Standard (2007-07-04 14:50:59)  

## 2017-05-08 ENCOUNTER — Encounter: Payer: Self-pay | Admitting: Psychiatry

## 2017-07-12 ENCOUNTER — Other Ambulatory Visit: Payer: Self-pay | Admitting: Psychiatry

## 2017-07-12 DIAGNOSIS — F411 Generalized anxiety disorder: Secondary | ICD-10-CM

## 2017-07-12 DIAGNOSIS — F330 Major depressive disorder, recurrent, mild: Secondary | ICD-10-CM

## 2017-08-06 ENCOUNTER — Other Ambulatory Visit: Payer: Self-pay | Admitting: Internal Medicine

## 2017-08-06 DIAGNOSIS — Z1231 Encounter for screening mammogram for malignant neoplasm of breast: Secondary | ICD-10-CM

## 2017-09-12 ENCOUNTER — Ambulatory Visit
Admission: RE | Admit: 2017-09-12 | Discharge: 2017-09-12 | Disposition: A | Discharge status: Home / Self Care | Payer: 59 | Source: Ambulatory Visit | Attending: Internal Medicine | Admitting: Internal Medicine

## 2017-09-12 DIAGNOSIS — Z1231 Encounter for screening mammogram for malignant neoplasm of breast: Secondary | ICD-10-CM | POA: Diagnosis not present

## 2017-10-08 ENCOUNTER — Encounter: Payer: Self-pay | Admitting: Certified Nurse Midwife

## 2017-10-08 ENCOUNTER — Ambulatory Visit (INDEPENDENT_AMBULATORY_CARE_PROVIDER_SITE_OTHER): Payer: 59 | Admitting: Certified Nurse Midwife

## 2017-10-08 VITALS — BP 102/60 | Ht 68.5 in | Wt 210.0 lb

## 2017-10-08 DIAGNOSIS — Z01419 Encounter for gynecological examination (general) (routine) without abnormal findings: Secondary | ICD-10-CM

## 2017-10-08 DIAGNOSIS — Z124 Encounter for screening for malignant neoplasm of cervix: Secondary | ICD-10-CM

## 2017-10-08 DIAGNOSIS — B369 Superficial mycosis, unspecified: Secondary | ICD-10-CM | POA: Diagnosis not present

## 2017-10-08 DIAGNOSIS — F419 Anxiety disorder, unspecified: Secondary | ICD-10-CM | POA: Insufficient documentation

## 2017-10-08 MED ORDER — CLOTRIMAZOLE-BETAMETHASONE 1-0.05 % EX CREA: 1.0000 "application " | TOPICAL_CREAM | Freq: Two times a day (BID) | CUTANEOUS | 1 refills | Status: DC

## 2017-10-08 NOTE — Progress Notes (Signed)
Gynecology Annual Exam  PCP: Dr Terese Door Chief Complaint:  Chief Complaint  Patient presents with  . Gynecologic Exam    History of Present Illness: Patient is a 57 y.o. H7C1638 presents for annual exam. The patient complains of a pruritic rash on her upper left labia and left inner thigh.   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 She has had no spotting. Hot flashes occasionally during the day usually with increased stress. Past Medical history is significant for hypertension, anxiety, depression, tonsillar stones, urinary stones, and a left breast biopsy (2015-benign). Since her last annual exam in 05/29/2016, she has had no significant changes in her health. She is now seeing Dr Shea Evans for her anxiety. Her Paxil dose was increased to 60 mgm daily. Last pap smear was 05/29/2016 and was NIL/neg. She had a ASCUS PAP with positive HRHPV Pap  in 2013. Colpo directed biopsies done-HPV effect. Last mammogram was 09/12/17 and was negative. She has a family history of breast cancer in her MGM, mother, and paternal GM. Genetic testing has not been done. There is a family history of colon cancer in her maternal aunt. Genetic testing has not been done. She has had several colonoscopies. Her last was 12/26/2016 by Dr Marius Ditch and several tubular adenomatous polyps were removed. Was advised to repeat colonoscopy in 3 years.   She does smoke 1 PPD of cigarettes. Started smoking at age 53. Has stopped twice cold [place] x 2 years each time. Is not ready to quit. 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  She drinks alcohol infrequently She does not exercise, other than cleaning. She does get adequate calcium in her diet. Her last lipid panel was done in 2018 by her PCP and it was abnormal.     Review of Systems: Review of Systems  Constitutional: Positive for malaise/fatigue. Negative for chills, fever  and weight loss.  HENT: Positive for congestion and sore throat. Negative for ear pain and sinus pain.   Eyes: Negative for blurred vision and pain.  Respiratory: Negative for hemoptysis, shortness of breath and wheezing.   Cardiovascular: Positive for palpitations. Negative for chest pain and leg swelling.  Gastrointestinal: Positive for abdominal pain (IBS) and diarrhea (IBS). Negative for blood in stool, heartburn, nausea and vomiting.  Genitourinary: Negative for dysuria, frequency, hematuria and urgency.  Musculoskeletal: Positive for joint pain. Negative for back pain and myalgias.  Skin: Positive for itching and rash.  Neurological: Positive for dizziness. Negative for tingling and headaches.  Endo/Heme/Allergies: Negative for environmental allergies and polydipsia. Does not bruise/bleed easily.       Negative for hirsutism; positive for hot flashes, heat intolerance   Psychiatric/Behavioral: Negative for depression. The patient is nervous/anxious. The patient does not have insomnia.     Past Medical History:  Past Medical History:  Diagnosis Date  . Abnormal mammogram, unspecified   . Anxiety   . Arthritis   . Chronic kidney disease   . Depression   . Family history of breast cancer   . Family history of colon cancer   . Hypercalcemia 2004  . Hypertension    (pt denies)  . IBS (irritable bowel syndrome) 2004  . Palpitations   . PONV (postoperative nausea and vomiting)   . Tobacco abuse   . Urinary calculus   . Wears contact lenses     Past Surgical History:  Past  Surgical History:  Procedure Laterality Date  . BILATERAL SALPINGOOPHORECTOMY  2010   bilateral mucinous cystadenomas/ Dr Rayford [facility]sted [facility]  . BREAST BIOPSY Left    neg  . CHOLECYSTECTOMY  2006  . COLONOSCOPY  08/17/2013   small polyps  . COLONOSCOPY WITH PROPOFOL N/A 12/26/2016   Procedure: COLONOSCOPY WITH PROPOFOL;  Surgeon: Lin Landsman, MD;  Location: [place];  Service:  Endoscopy;  Laterality: N/A;  . COLPOSCOPY  2013   +HRHPV  . LAPAROSCOPIC SUPRACERVICAL HYSTERECTOMY  11/23/2008   Dr Florencia Reasons  . LAPAROSCOPIC TUBAL LIGATION       Obstetric History: [id number]  Family History:  Family History  Problem Relation Age of Onset  . Leukemia Cousin 22  . Breast cancer Paternal Grandmother 63  . Schizophrenia Paternal Grandmother   . Diabetes Paternal Grandmother   . Colon cancer Maternal Aunt        36s  . Leukemia Maternal Grandfather 97  . Breast cancer Maternal Grandmother 80  . Breast cancer Mother 37  . Depression Mother   . Anxiety disorder Mother   . Diabetes Sister   . Anxiety disorder Sister   . Depression Sister   . Schizophrenia Son   . ADD / ADHD Son   . Drug abuse Son   . Alcohol abuse Son   . Schizophrenia Father   . Heart attack Father   . Stroke Father   . Schizophrenia Paternal Uncle   . Schizophrenia Brother     Social History:  Social History   Socioeconomic History  . Marital status: Divorced    Spouse name: Not on file  . Number of children: 2  . Years of education: Not on file  . Highest education level: Some college, no degree  Occupational History  . Not on file  Social Needs  . Financial resource strain: Somewhat hard  . Food insecurity:    Worry: Never true    Inability: Never true  . Transportation needs:    Medical: No    Non-medical: No  Tobacco Use  . Smoking status: Current Every Day Smoker    Packs/day: 1.00    Years: 36.00    Pack years: 36.00    Types: Cigarettes    Start date: 54  . Smokeless tobacco: Never Used  . Tobacco comment: off and on since age 28  Substance and Sexual Activity  . Alcohol use: No    Comment: rare  . Drug use: No  . Sexual activity: Not Currently  Lifestyle  . Physical activity:    Days per week: 0 days    Minutes per session: 0 min  . Stress: Very much  Relationships  . Social connections:    Talks on phone: Three times a week    Gets  together: Not on file    Attends religious service: Never    Active member of club or organization: Yes    Attends meetings of clubs or organizations: More than 4 times per year    Relationship status: Divorced  . Intimate partner violence:    Fear of current or ex partner: No    Emotionally abused: No    Physically abused: No    Forced sexual activity: No  Other Topics Concern  . Not on file  Social History Narrative  . Not on file    Allergies:  Allergies  Allergen Reactions  . Codeine Nausea And Vomiting    Medications:  Current Outpatient Medications on File Prior to  Visit  Medication Sig Dispense Refill  . atenolol (TENORMIN) 50 MG tablet Take 50 mg by mouth daily.    . hydrochlorothiazide (HYDRODIURIL) 25 MG tablet Take 25 mg by mouth daily.    Marland Kitchen PARoxetine (PAXIL) 30 MG tablet TAKE 2 TABLETS(60 MG) BY MOUTH DAILY 180 tablet 1  . hydrOXYzine (VISTARIL) 25 MG capsule Take 1 capsule (25 mg total) by mouth 2 (two) times daily as needed (for anxiety sx and for sleep problems.). (Patient not taking: Reported on 10/08/2017) 60 capsule 1   No current facility-administered medications on file prior to visit.        Physical Exam Vitals: BP 102/60   Ht 5' 8.5" (1.74 m)   Wt 210 lb (95.3 kg)   BMI 31.47 kg/m   General: NAD. Pleasant, well nourished, WF HEENT: normocephalic, anicteric Thyroid: no enlargement, no palpable nodules Pulmonary: No increased work of breathing, CTAB Cardiovascular: RRR without murmur Breast: Breast symmetrical, no tenderness, no palpable nodules or masses, no skin or nipple retraction present, no nipple discharge.  No axillary, infraclavicular,  or supraclavicular lymphadenopathy. Abdomen: soft, generalized tenderness, non-distended.  No hepatomegaly or masses palpable. No evidence of hernia  Genitourinary:  External:  Normal urethral meatus, normal Bartholin's and Skene's glands. Red maculopapular rash distributed in a round patch involving the  upper left labia majora and inner left thigh. Another patch on upper left buttock. Both with sharp borders   Vagina: Normal vaginal mucosa, no evidence of prolapse.    Cervix: Grossly normal in appearance, no bleeding  Uterus: surgically asent  Adnexa:  no adnexal masses, NT  Rectal: deferred  Lymphatic: no evidence of inguinal lymphadenopathy Extremities: no edema, erythema, or tenderness Neurologic: Grossly intact Psychiatric: mood appropriate, affect full  KOH of skin scraping of rash: positive for peudohyphae   Assessment: 57 y.o. [id number] annual gyn exam.  Family history of breast and colon cancer Fungal infection of skin of labia, inner thigh and buttock  Lotrisone cream BID Tobacco use-discussed tobacco cessation-not ready to quit due to level of stress in her life Anxiety   Plan:   1) Breast cancer screening: recommend monthly SBE and yearly screening mammogram  -offered MYRISK testing in the past and patient declined.  2) Cervical cancer screening-Pap smear done  3) Osteoporosis prevention- discussed calcium and vitamin D3 requirements/ Recommend vitamin D3 supplements and weight bearing exercise  4) Routine healthcare maintenance including cholesterol, diabetes screening per her PCP  5) Colonoscopy -UTD-next due in 2021   6) Follow up 1 year for routine annual   Dalia Heading, [place]

## 2017-10-11 LAB — IGP,RFX APTIMA HPV ALL PTH: PAP Smear Comment: 0

## 2018-04-05 ENCOUNTER — Other Ambulatory Visit: Payer: Self-pay | Admitting: Psychiatry

## 2018-04-05 DIAGNOSIS — F411 Generalized anxiety disorder: Secondary | ICD-10-CM

## 2018-04-05 DIAGNOSIS — F330 Major depressive disorder, recurrent, mild: Secondary | ICD-10-CM

## 2018-05-20 ENCOUNTER — Other Ambulatory Visit: Payer: Self-pay | Admitting: Family

## 2018-05-20 DIAGNOSIS — R110 Nausea: Secondary | ICD-10-CM

## 2018-05-20 DIAGNOSIS — R7989 Other specified abnormal findings of blood chemistry: Secondary | ICD-10-CM

## 2018-05-26 ENCOUNTER — Ambulatory Visit
Admission: RE | Admit: 2018-05-26 | Discharge: 2018-05-26 | Disposition: A | Discharge status: Home / Self Care | Payer: 59 | Source: Ambulatory Visit | Attending: Family | Admitting: Family

## 2018-05-26 ENCOUNTER — Other Ambulatory Visit: Payer: Self-pay

## 2018-05-26 DIAGNOSIS — R7989 Other specified abnormal findings of blood chemistry: Secondary | ICD-10-CM | POA: Diagnosis present

## 2018-05-26 DIAGNOSIS — R110 Nausea: Secondary | ICD-10-CM | POA: Insufficient documentation

## 2018-08-26 ENCOUNTER — Other Ambulatory Visit: Payer: Self-pay | Admitting: Internal Medicine

## 2018-08-26 DIAGNOSIS — Z1231 Encounter for screening mammogram for malignant neoplasm of breast: Secondary | ICD-10-CM

## 2018-10-01 ENCOUNTER — Ambulatory Visit
Admission: RE | Admit: 2018-10-01 | Discharge: 2018-10-01 | Disposition: A | Discharge status: Home / Self Care | Payer: 59 | Source: Ambulatory Visit | Attending: Internal Medicine | Admitting: Internal Medicine

## 2018-10-01 ENCOUNTER — Other Ambulatory Visit: Payer: Self-pay

## 2018-10-01 DIAGNOSIS — Z1231 Encounter for screening mammogram for malignant neoplasm of breast: Secondary | ICD-10-CM | POA: Diagnosis present

## 2019-09-08 ENCOUNTER — Other Ambulatory Visit: Payer: Self-pay | Admitting: Internal Medicine

## 2019-09-08 DIAGNOSIS — Z1231 Encounter for screening mammogram for malignant neoplasm of breast: Secondary | ICD-10-CM

## 2019-10-06 ENCOUNTER — Other Ambulatory Visit: Payer: Self-pay

## 2019-10-06 ENCOUNTER — Ambulatory Visit
Admission: RE | Admit: 2019-10-06 | Discharge: 2019-10-06 | Disposition: A | Discharge status: Home / Self Care | Payer: 59 | Source: Ambulatory Visit | Attending: Internal Medicine | Admitting: Internal Medicine

## 2019-10-06 DIAGNOSIS — Z1231 Encounter for screening mammogram for malignant neoplasm of breast: Secondary | ICD-10-CM | POA: Insufficient documentation

## 2019-12-02 IMAGING — US ULTRASOUND ABDOMEN COMPLETE
1 series · 13 of 25 positions shown · non-contrast
Comparison: 10/13/2008 CT with contrast

CLINICAL DATA: Nausea, abdominal pain, abnormal LFTs

EXAM:
ABDOMEN ULTRASOUND COMPLETE

[Series 1: ultrasound abdomen complete · 0.23mm/px · 13 of 85 slices shown]
[im 1/85]
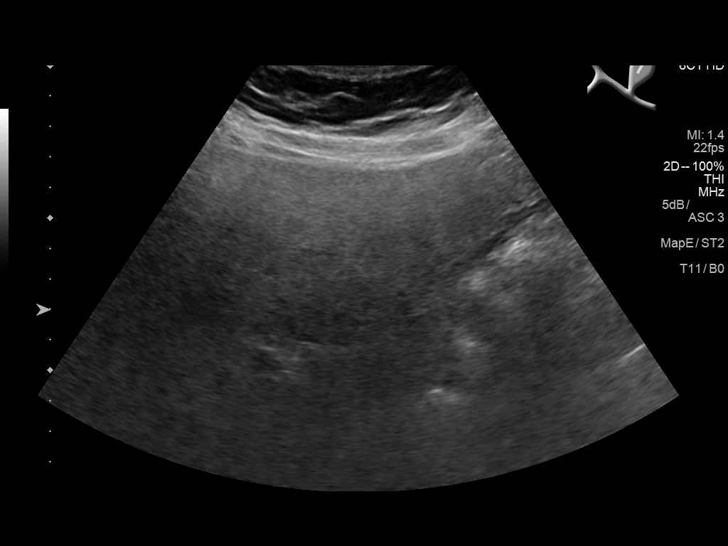
[im 8/85]
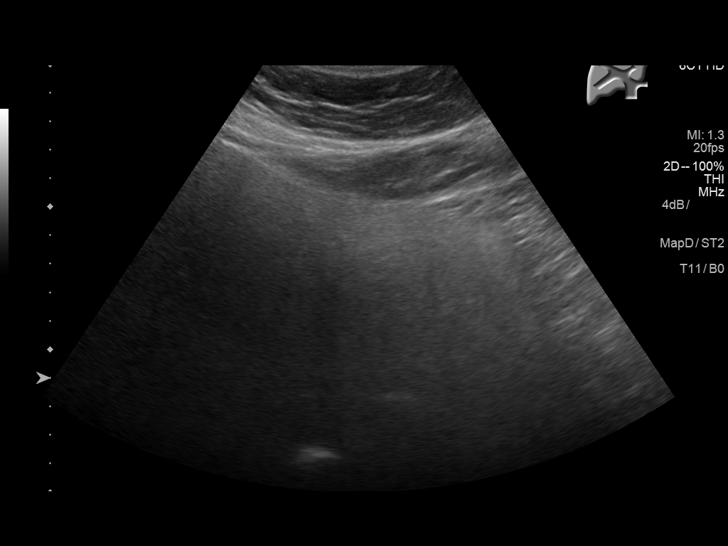
[im 15/85]
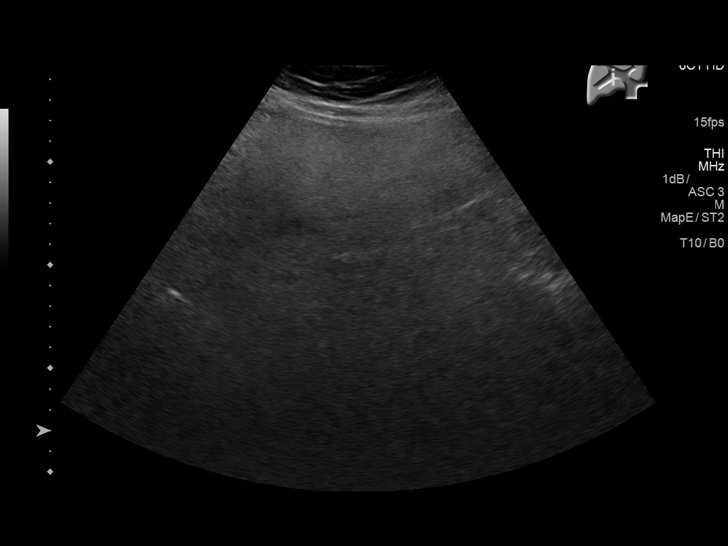
[im 22/85]
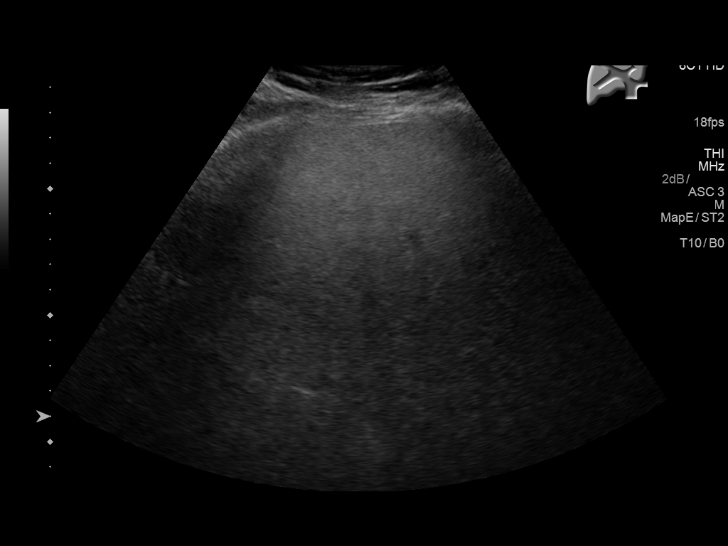
[im 29/85]
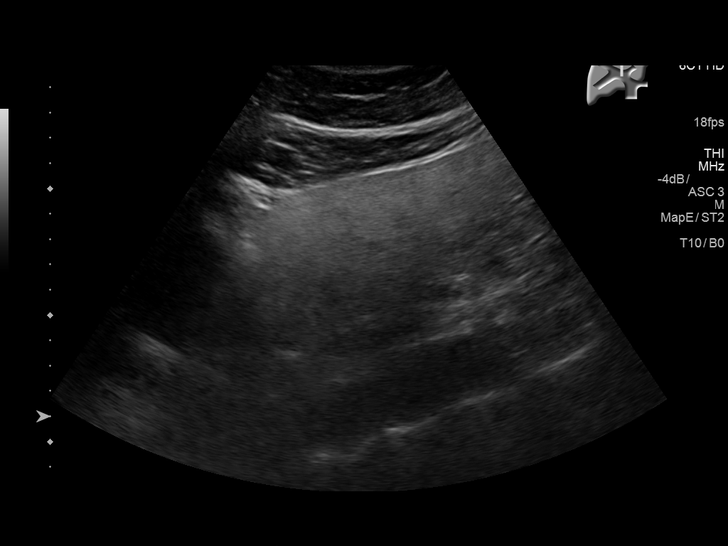
[im 36/85]
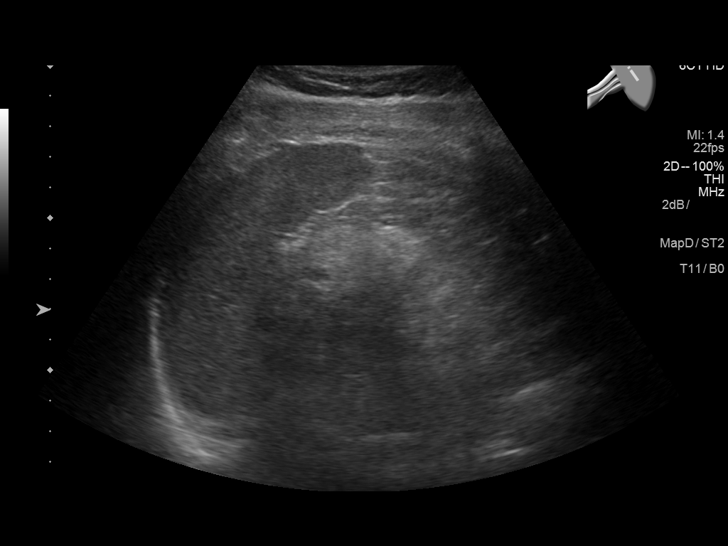
[im 43/85]
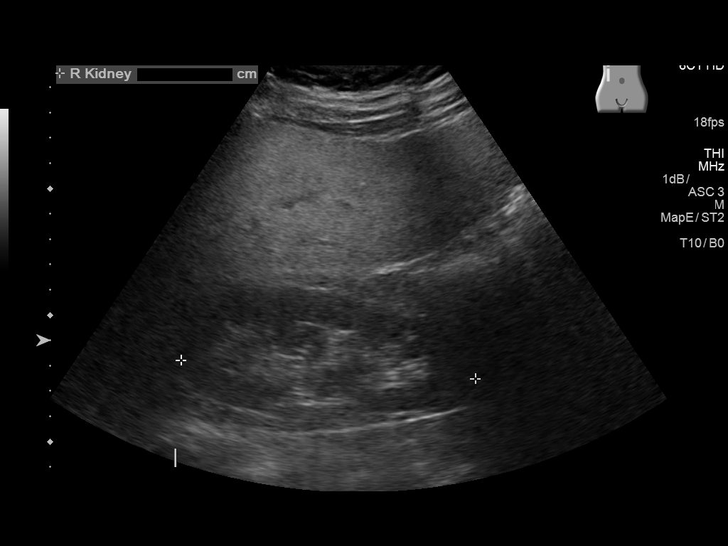
[im 50/85]
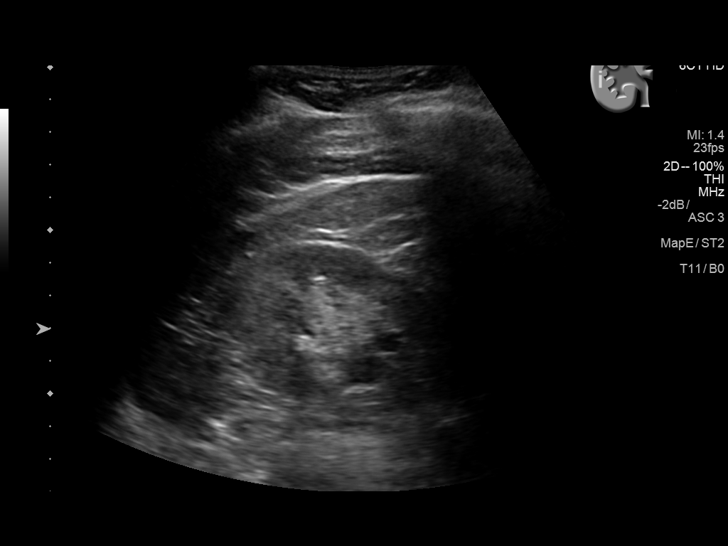
[im 57/85]
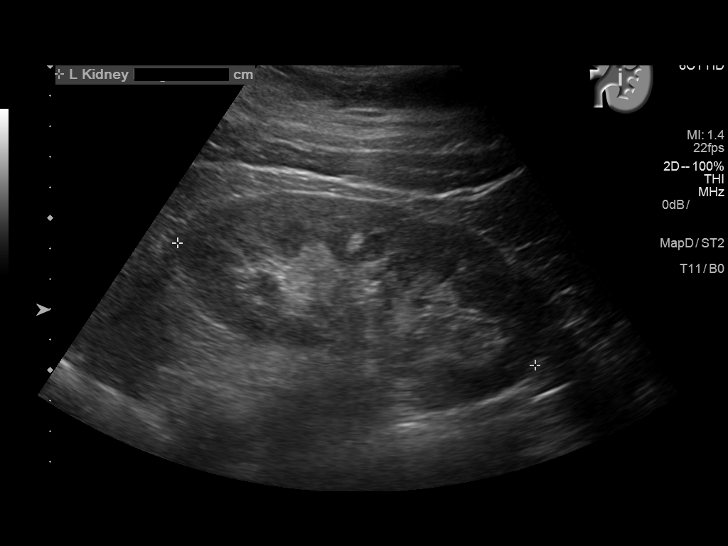
[im 64/85]
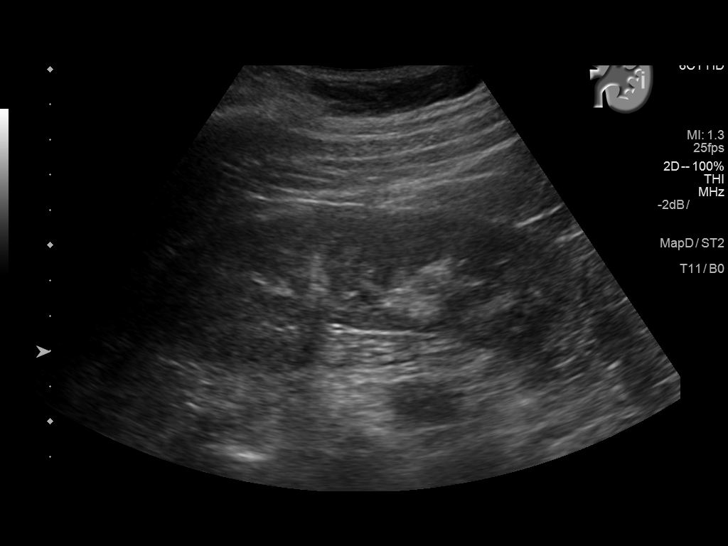
[im 71/85]
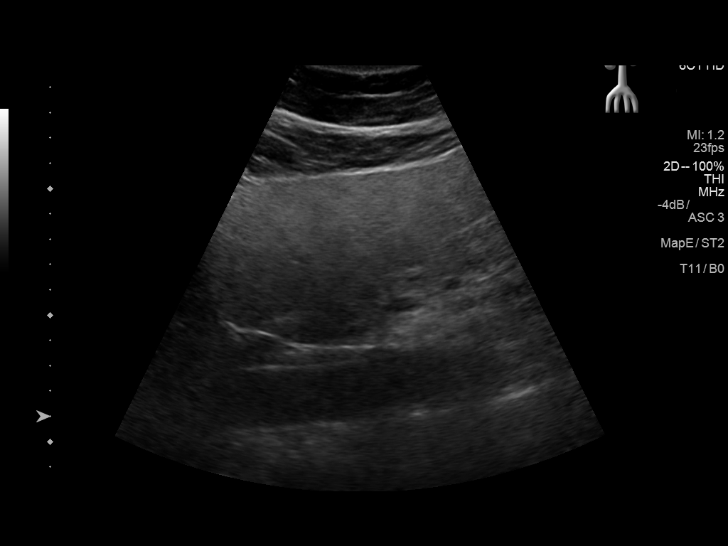
[im 78/85]
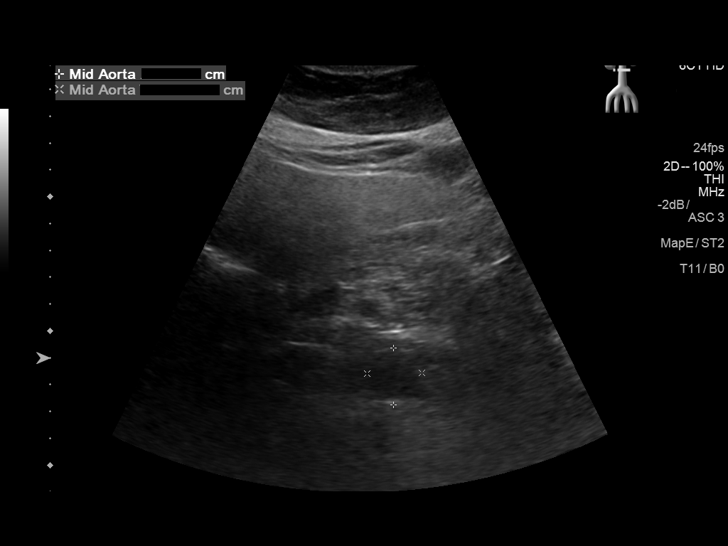
[im 85/85]
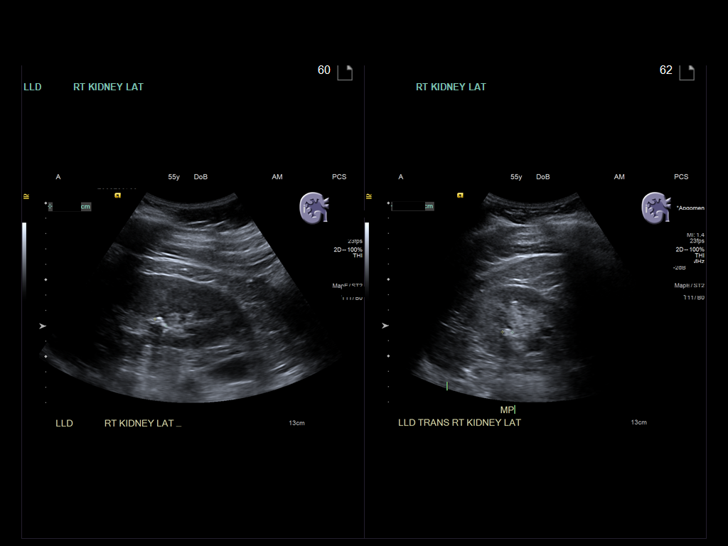

[13 of 25 positions shown; findings below may reference images not displayed]

FINDINGS: Gallbladder: Surgically absent

Common bile duct: Diameter: 7 mm

Liver: Increased echogenicity compatible with hepatic steatosis.
This limits evaluation for focal abnormality. No intrahepatic
biliary dilatation or significant finding within the limits of the
study. No surrounding free fluid or ascites. Portal vein is patent
on color Doppler imaging with normal direction of blood flow towards
the liver.

IVC: No abnormality visualized.

Pancreas: Not visualized, obscured by bowel gas

Spleen: Size and appearance within normal limits.

Right Kidney: Length: 11.7 cm. Normal cortical echogenicity. No
hydronephrosis. Midpole echogenic shadowing calculus measures 8 mm.

Left Kidney: Length: 12.4 cm. Normal echogenicity. No
hydronephrosis. 1.8 cm mid to lower pole anechoic cyst noted. No
other acute finding.

Abdominal aorta: No aneurysm visualized.

Other findings: No free fluid or ascites
IMPRESSION: Remote cholecystectomy

Hepatic steatosis

Nonobstructing right nephrolithiasis

1.8 cm left renal mid to lower pole cyst

No acute finding by ultrasound

## 2020-09-26 ENCOUNTER — Other Ambulatory Visit: Payer: Self-pay

## 2020-09-26 ENCOUNTER — Emergency Department
Admission: EM | Admit: 2020-09-26 | Discharge: 2020-09-26 | Disposition: A | Discharge status: Home / Self Care | Payer: 59 | Attending: Emergency Medicine | Admitting: Emergency Medicine

## 2020-09-26 DIAGNOSIS — M545 Low back pain, unspecified: Secondary | ICD-10-CM

## 2020-09-26 DIAGNOSIS — X500XXA Overexertion from strenuous movement or load, initial encounter: Secondary | ICD-10-CM | POA: Insufficient documentation

## 2020-09-26 DIAGNOSIS — N189 Chronic kidney disease, unspecified: Secondary | ICD-10-CM | POA: Insufficient documentation

## 2020-09-26 DIAGNOSIS — S3992XA Unspecified injury of lower back, initial encounter: Secondary | ICD-10-CM | POA: Diagnosis present

## 2020-09-26 DIAGNOSIS — I129 Hypertensive chronic kidney disease with stage 1 through stage 4 chronic kidney disease, or unspecified chronic kidney disease: Secondary | ICD-10-CM | POA: Insufficient documentation

## 2020-09-26 DIAGNOSIS — F17210 Nicotine dependence, cigarettes, uncomplicated: Secondary | ICD-10-CM | POA: Diagnosis not present

## 2020-09-26 DIAGNOSIS — S39012A Strain of muscle, fascia and tendon of lower back, initial encounter: Secondary | ICD-10-CM | POA: Diagnosis not present

## 2020-09-26 DIAGNOSIS — Z79899 Other long term (current) drug therapy: Secondary | ICD-10-CM | POA: Insufficient documentation

## 2020-09-26 MED ORDER — CYCLOBENZAPRINE HCL 10 MG PO TABS: 10.0000 mg | ORAL_TABLET | Freq: Once | ORAL | Status: DC

## 2020-09-26 MED ORDER — CYCLOBENZAPRINE HCL 10 MG PO TABS: 10.0000 mg | ORAL_TABLET | Freq: Three times a day (TID) | ORAL | 0 refills | Status: AC | PRN

## 2020-09-26 NOTE — ED Triage Notes (Signed)
Pt comes into the ED via EMS from home with c/o lower back pain since yesterday after moving a couch.Marland Kitchen

## 2020-09-26 NOTE — ED Provider Notes (Signed)
[facility] Emergency Department Provider Note   ____________________________________________   Event Date/Time   First MD Initiated Contact with Patient 09/26/20 1106     (approximate)  I have reviewed the triage vital signs and the nursing notes.   HISTORY  Chief Complaint Back Pain    HPI Shari Foster is a 60 y.o. female with the below stated past medical history presents for low back pain  LOCATION: Bilateral lower lumbar paraspinal musculature DURATION: 24 hours TIMING: Stable since onset SEVERITY: 9/10 QUALITY: Aching/throbbing CONTEXT: Patient states that she was moving heavy furniture yesterday woke up this morning with severe bilateral lower lumbar paraspinal musculature pain MODIFYING FACTORS: Worsens this pain and rest as well as ibuprofen partially relieves it ASSOCIATED SYMPTOMS: Denies   Per medical record review, history noncontributory          Past Medical History:  Diagnosis Date   Abnormal mammogram, unspecified    Anxiety    Arthritis    Chronic kidney disease    Depression    Family history of breast cancer    Family history of colon cancer    Hypercalcemia 2004   Hypertension    (pt denies)   IBS (irritable bowel syndrome) 2004   Palpitations    PONV (postoperative nausea and vomiting)    Tobacco abuse    Urinary calculus    Wears contact lenses     Patient Active Problem List   Diagnosis Date Noted   Anxiety 10/08/2017   High risk for colon cancer    Hematuria, microscopic 08/29/2016   Hypercalcinuria 08/29/2016   Nephrolithiasis 0000000   Renal colic 0000000   Family history of colon cancer 05/29/2016   Family history of breast cancer 05/29/2016   Depression 05/29/2016   Hypertension 05/29/2016   Tobacco abuse 05/29/2016    Past Surgical History:  Procedure Laterality Date   BILATERAL SALPINGOOPHORECTOMY  2010   bilateral mucinous cystadenomas/ Dr Rayford Halsted with [facility]   BREAST BIOPSY Left     neg   CHOLECYSTECTOMY  2006   COLONOSCOPY  08/17/2013   small polyps   COLONOSCOPY WITH PROPOFOL N/A 12/26/2016   Procedure: COLONOSCOPY WITH PROPOFOL;  Surgeon: Lin Landsman, MD;  Location: [place];  Service: Endoscopy;  Laterality: N/A;   COLPOSCOPY  2013   +HRHPV   LAPAROSCOPIC SUPRACERVICAL HYSTERECTOMY  11/23/2008   Dr Florencia Reasons   LAPAROSCOPIC TUBAL LIGATION      Prior to Admission medications   Medication Sig Start Date End Date Taking? Authorizing Provider  cyclobenzaprine (FLEXERIL) 10 MG tablet Take 1 tablet (10 mg total) by mouth 3 (three) times daily as needed for up to 4 days for muscle spasms (left chest pain). 09/26/20 09/30/20 Yes Naaman Plummer, MD  atenolol (TENORMIN) 50 MG tablet Take 50 mg by mouth daily.    [provider]  clotrimazole-betamethasone (LOTRISONE) cream Apply 1 application topically 2 (two) times daily. 10/08/17   Dalia Heading, CNM  hydrochlorothiazide (HYDRODIURIL) 25 MG tablet Take 25 mg by mouth daily.    [provider]  hydrOXYzine (VISTARIL) 25 MG capsule Take 1 capsule (25 mg total) by mouth 2 (two) times daily as needed (for anxiety sx and for sleep problems.). Patient not taking: Reported on 10/08/2017 05/07/17   Ursula Alert, MD  PARoxetine (PAXIL) 30 MG tablet TAKE 2 TABLETS(60 MG) BY MOUTH DAILY 07/12/17   Ursula Alert, MD    Allergies Codeine  Family History  Problem Relation Age of Onset  Leukemia Cousin 35   Breast cancer Paternal Grandmother 34   Schizophrenia Paternal Grandmother    Diabetes Paternal Grandmother    Colon cancer Maternal Aunt        31s   Leukemia Maternal Grandfather 3   Breast cancer Maternal Grandmother 16   Breast cancer Mother 66   Depression Mother    Anxiety disorder Mother    Diabetes Sister    Anxiety disorder Sister    Depression Sister    Schizophrenia Son    ADD / ADHD Son    Drug abuse Son    Alcohol abuse Son    Schizophrenia Father     Heart attack Father    Stroke Father    Schizophrenia Paternal Uncle    Schizophrenia Brother     Social History Social History   Tobacco Use   Smoking status: Every Day    Packs/day: 1.00    Years: 36.00    Pack years: 36.00    Types: Cigarettes    Start date: 1977   Smokeless tobacco: Never   Tobacco comments:    off and on since age 83  Vaping Use   Vaping Use: Never used  Substance Use Topics   Alcohol use: No    Comment: rare   Drug use: No    Review of Systems Constitutional: No fever/chills Eyes: No visual changes. ENT: No sore throat. Cardiovascular: Denies chest pain. Respiratory: Denies shortness of breath. Gastrointestinal: No abdominal pain.  No nausea, no vomiting.  No diarrhea. Genitourinary: Negative for dysuria. Musculoskeletal: Positive for acute low back pain Skin: Negative for rash. Neurological: Negative for headaches, weakness/numbness/paresthesias in any extremity Psychiatric: Negative for suicidal ideation/homicidal ideation   ____________________________________________   PHYSICAL EXAM:  VITAL SIGNS: ED Triage Vitals  Enc Vitals Group     BP 09/26/20 0932 137/82     Pulse Rate 09/26/20 0932 83     Resp 09/26/20 0932 18     Temp 09/26/20 0932 98.3 F (36.8 C)     Temp Source 09/26/20 0932 Oral     SpO2 09/26/20 0932 97 %     Weight --      Height --      Head Circumference --      Peak Flow --      Pain Score 09/26/20 1108 9     Pain Loc --      Pain Edu? --      Excl. in [place]? --    Constitutional: Alert and oriented. Well appearing and in no acute distress. Eyes: Conjunctivae are normal. PERRL. Head: Atraumatic. Nose: No congestion/rhinnorhea. Mouth/Throat: Mucous membranes are moist. Neck: No stridor Cardiovascular: Grossly normal heart sounds.  Good peripheral circulation. Respiratory: Normal respiratory effort.  No retractions. Gastrointestinal: Soft and nontender. No distention. Musculoskeletal: No obvious  deformities.  Tenderness to palpation in bilateral lower lumbar region with muscle spasm present Neurologic:  Normal speech and language. No gross focal neurologic deficits are appreciated. Skin:  Skin is warm and dry. No rash noted. Psychiatric: Mood and affect are normal. Speech and behavior are normal.  ____________________________________________   LABS (all labs ordered are listed, but only abnormal results are displayed)  Labs Reviewed - No data to display  PROCEDURES  Procedure(s) performed (including Critical Care):  Procedures   ____________________________________________   INITIAL IMPRESSION / ASSESSMENT AND PLAN / ED COURSE  As part of my medical decision making, I reviewed the following data within the electronic medical record, if available:  Nursing notes reviewed and incorporated, Labs reviewed, EKG interpreted, Old chart reviewed, Radiograph reviewed and Notes from prior ED visits reviewed and incorporated        Patient presents for low back pain. Given History and Exam the patient appears to be at low risk for Spinal Cord Compression Syndrome, Vertebral Malignancy/Mets, acute Spinal Fracture, Vertebral Osteomyelitis, Epidural Abscess, Infected or Obstructing Kidney Stone.  Their presentation appears most likely to be secondary to non-emergent musculoskeletal etiology vs non-emergent disc herniation.  ED Workup: Defer imaging and labwork for outpatient follow up at this time.  Disposition: Discharge. Strict return precautions discussed with patient with full understanding. Advised patient to follow up promptly with primary care provider      ____________________________________________   FINAL CLINICAL IMPRESSION(S) / ED DIAGNOSES  Final diagnoses:  Strain of lumbar region, initial encounter  Acute bilateral low back pain without sciatica     ED Discharge Orders          Ordered    cyclobenzaprine (FLEXERIL) 10 MG tablet  3 times daily PRN         09/26/20 1107             Note:  This document was prepared using Dragon voice recognition software and may include unintentional dictation errors.    Naaman Plummer, MD 09/26/20 1128

## 2020-11-03 ENCOUNTER — Other Ambulatory Visit: Payer: Self-pay | Admitting: Internal Medicine

## 2020-11-03 DIAGNOSIS — Z1231 Encounter for screening mammogram for malignant neoplasm of breast: Secondary | ICD-10-CM

## 2020-11-23 ENCOUNTER — Other Ambulatory Visit: Payer: Self-pay

## 2020-11-23 ENCOUNTER — Ambulatory Visit
Admission: RE | Admit: 2020-11-23 | Discharge: 2020-11-23 | Disposition: A | Discharge status: Home / Self Care | Payer: 59 | Source: Ambulatory Visit | Attending: Internal Medicine | Admitting: Internal Medicine

## 2020-11-23 DIAGNOSIS — Z1231 Encounter for screening mammogram for malignant neoplasm of breast: Secondary | ICD-10-CM | POA: Insufficient documentation

## 2021-11-21 ENCOUNTER — Other Ambulatory Visit: Payer: Self-pay | Admitting: Internal Medicine

## 2021-11-21 DIAGNOSIS — Z1231 Encounter for screening mammogram for malignant neoplasm of breast: Secondary | ICD-10-CM

## 2021-12-12 ENCOUNTER — Ambulatory Visit
Admission: RE | Admit: 2021-12-12 | Discharge: 2021-12-12 | Disposition: A | Discharge status: Home / Self Care | Payer: 59 | Source: Ambulatory Visit | Attending: Internal Medicine | Admitting: Internal Medicine

## 2021-12-12 DIAGNOSIS — Z1231 Encounter for screening mammogram for malignant neoplasm of breast: Secondary | ICD-10-CM | POA: Insufficient documentation

## 2022-02-04 IMAGING — MG MM DIGITAL SCREENING BILAT W/ TOMO AND CAD
8 series · 8 of 24 positions shown · non-contrast
Comparison: Previous exam(s).

CLINICAL DATA: Screening.

EXAM:
DIGITAL SCREENING BILATERAL MAMMOGRAM WITH TOMOSYNTHESIS AND CAD
TECHNIQUE: Bilateral screening digital craniocaudal and mediolateral oblique
mammograms were obtained. Bilateral screening digital breast
tomosynthesis was performed. The images were evaluated with
computer-aided detection.

[L MLO synth-2D]
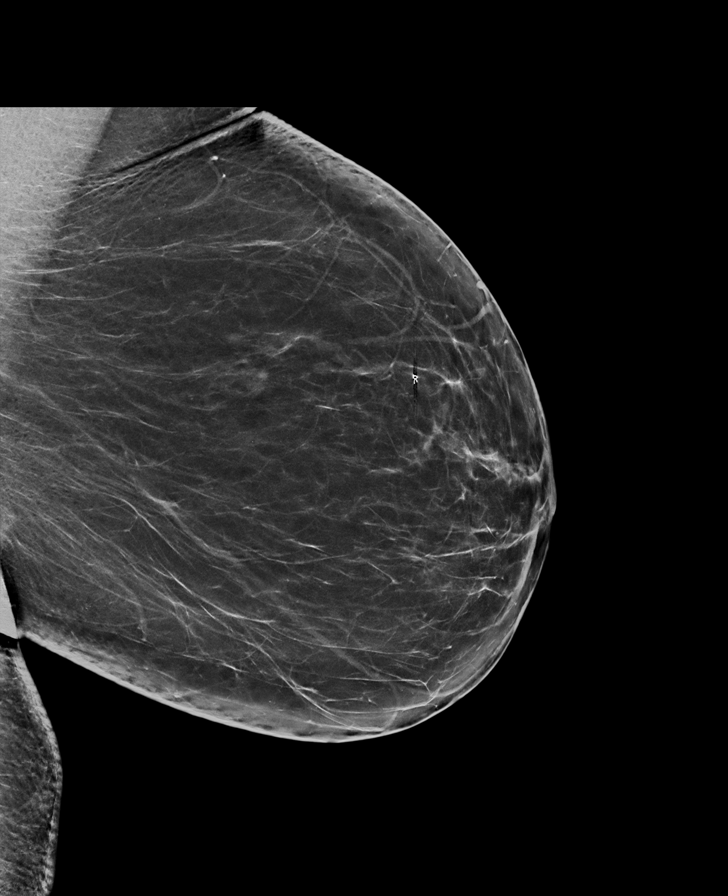

[R CC synth-2D]
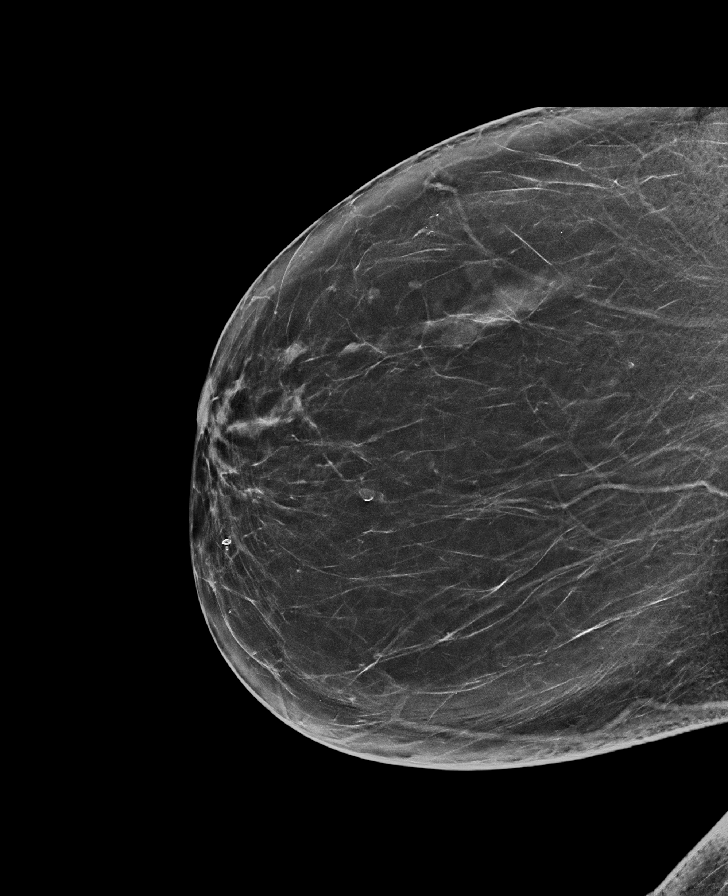

[R MLO synth-2D]
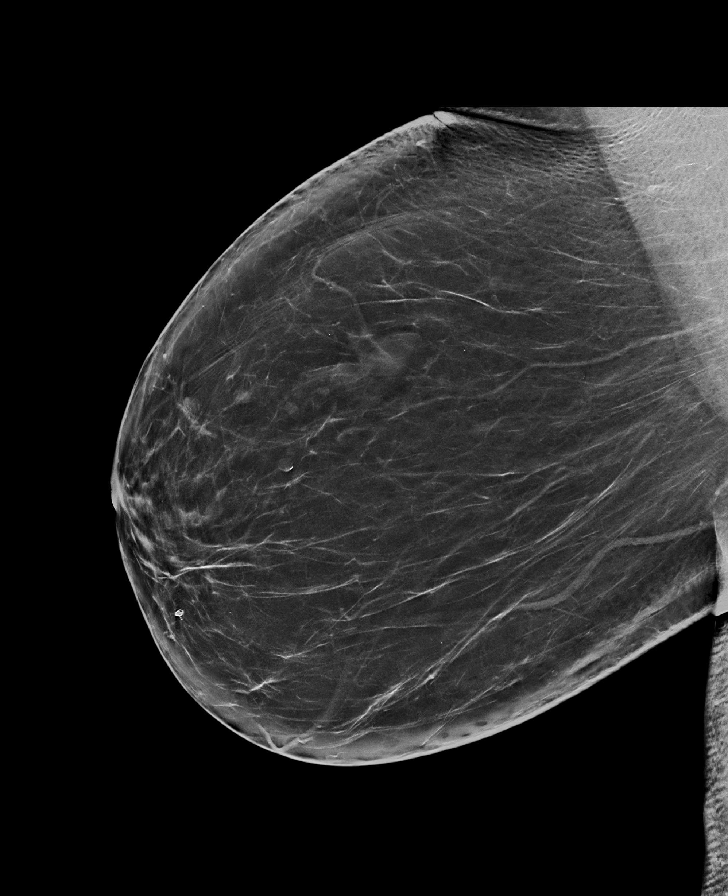

[L CC synth-2D]
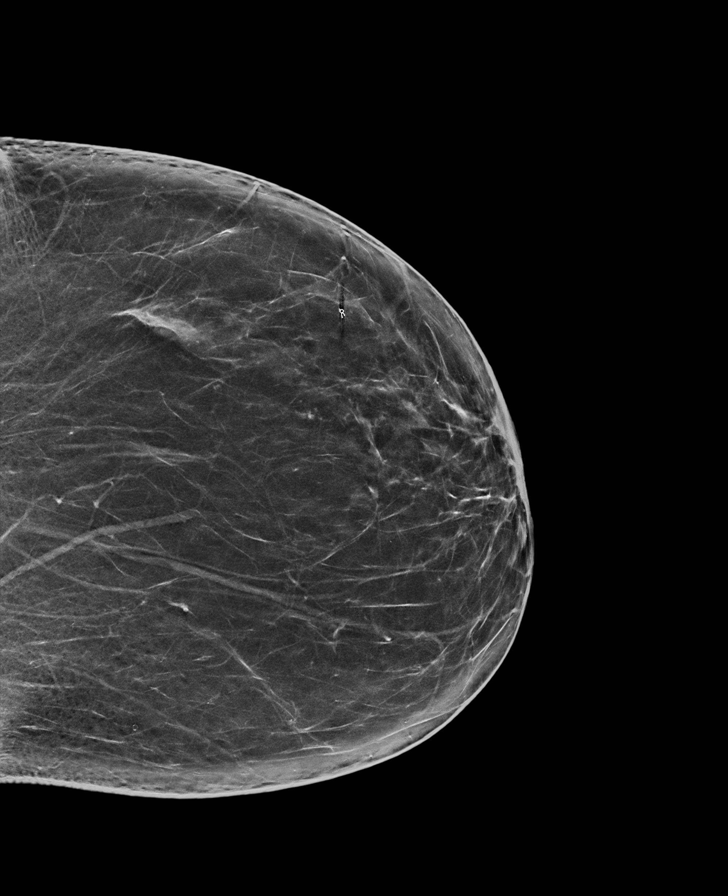

[R MLO tomo · tomo slice 43/84.0]
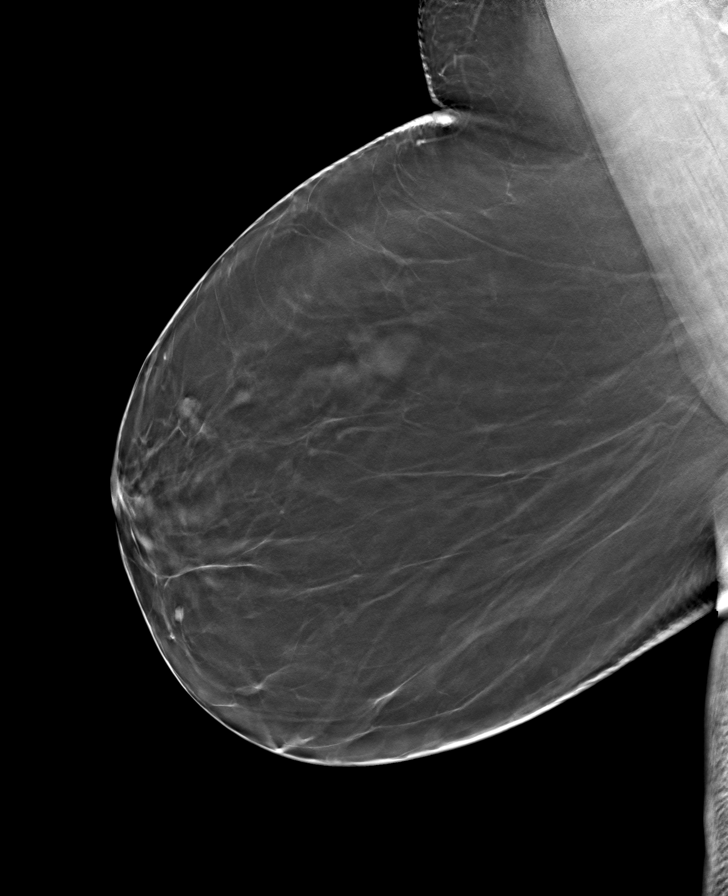

[R CC tomo · tomo slice 39/76.0]
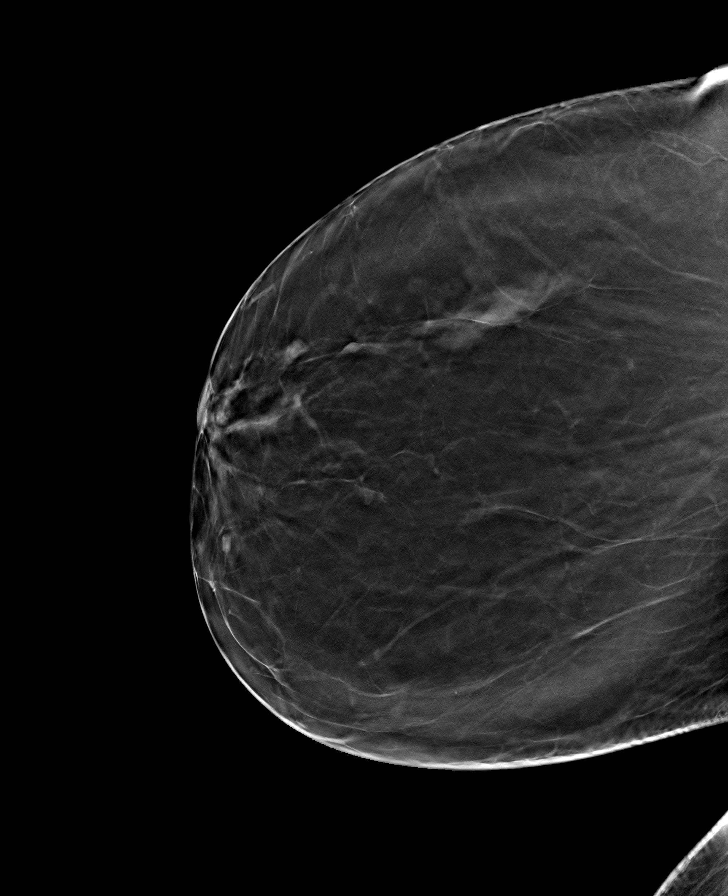

[L CC tomo · tomo slice 37/74.0]
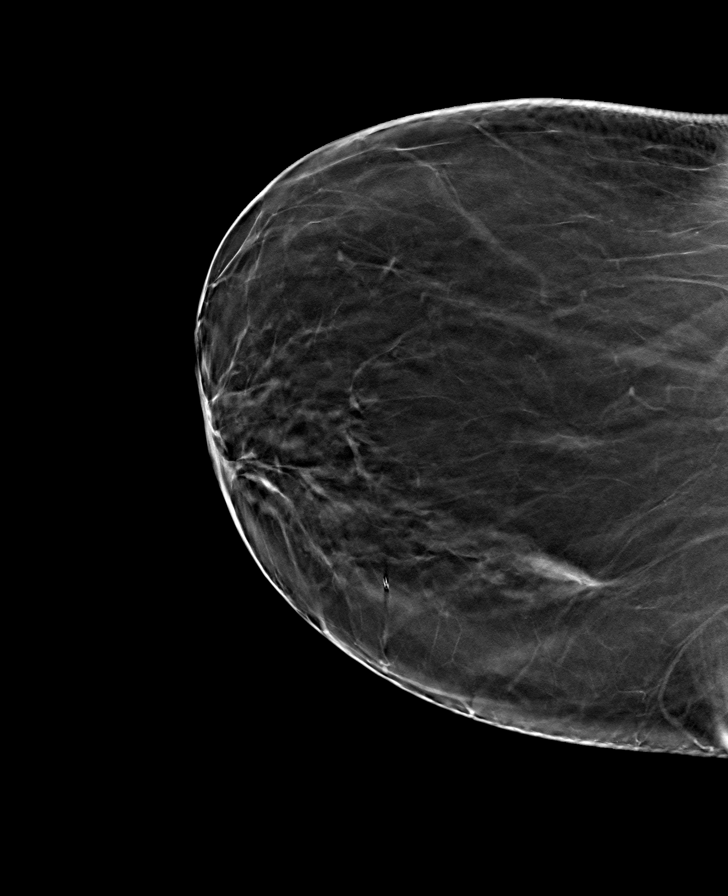

[L MLO tomo · tomo slice 41/82.0]
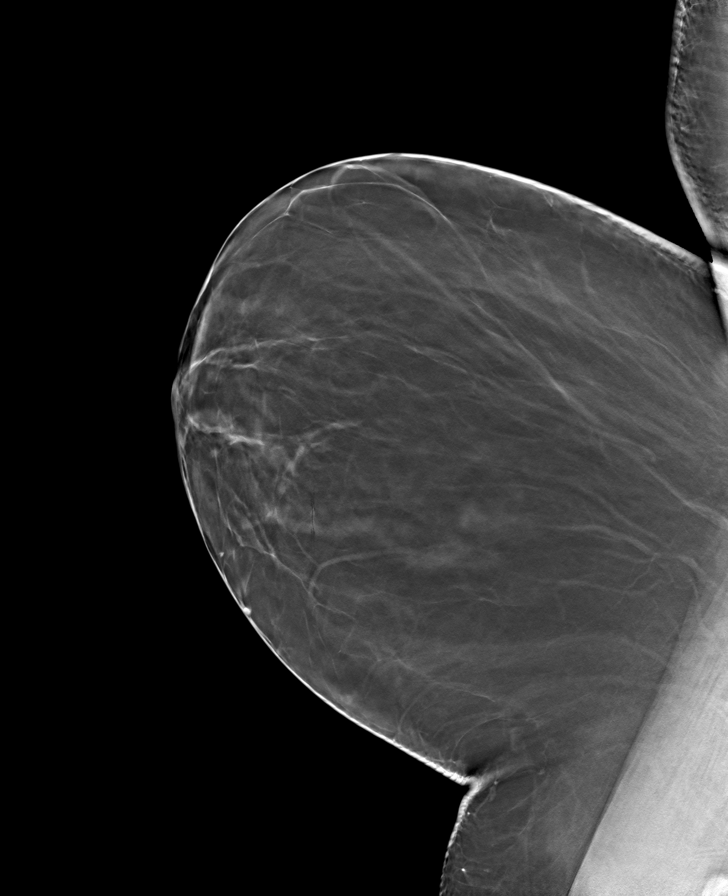

[8 of 24 positions shown; findings below may reference images not displayed]

ACR Breast Density Category b: There are scattered areas of
fibroglandular density.
FINDINGS: There are no findings suspicious for malignancy.
IMPRESSION: No mammographic evidence of malignancy. A result letter of this
screening mammogram will be mailed directly to the patient.

RECOMMENDATION:
Screening mammogram in one year. (Code:51-O-LD2)

BI-RADS CATEGORY  1: Negative.

## 2022-04-27 ENCOUNTER — Other Ambulatory Visit: Payer: Self-pay | Admitting: Internal Medicine

## 2022-04-27 DIAGNOSIS — I10 Essential (primary) hypertension: Secondary | ICD-10-CM

## 2022-05-05 ENCOUNTER — Other Ambulatory Visit: Payer: Self-pay | Admitting: Internal Medicine

## 2022-05-05 DIAGNOSIS — F32A Depression, unspecified: Secondary | ICD-10-CM

## 2022-05-23 ENCOUNTER — Telehealth: Payer: Self-pay | Admitting: Internal Medicine

## 2022-05-23 NOTE — Telephone Encounter (Signed)
Patient called requesting restarting her Xanax prescription due to having her schizophrenic son living in her home causing her panic attacks. Callback number [phone]

## 2022-09-03 ENCOUNTER — Other Ambulatory Visit: Payer: Self-pay | Admitting: Internal Medicine

## 2022-10-02 ENCOUNTER — Other Ambulatory Visit: Payer: Self-pay | Admitting: Internal Medicine

## 2022-10-02 MED ORDER — ATENOLOL 50 MG PO TABS: 50.0000 mg | ORAL_TABLET | Freq: Every day | ORAL | 0 refills | Status: DC

## 2022-10-09 ENCOUNTER — Encounter: Payer: Self-pay | Admitting: Internal Medicine

## 2022-10-09 ENCOUNTER — Ambulatory Visit (INDEPENDENT_AMBULATORY_CARE_PROVIDER_SITE_OTHER): Payer: Managed Care, Other (non HMO) | Admitting: Internal Medicine

## 2022-10-09 VITALS — BP 124/76 | HR 71 | Ht 68.5 in | Wt 193.2 lb

## 2022-10-09 DIAGNOSIS — F411 Generalized anxiety disorder: Secondary | ICD-10-CM

## 2022-10-09 DIAGNOSIS — R002 Palpitations: Secondary | ICD-10-CM | POA: Diagnosis not present

## 2022-10-09 DIAGNOSIS — R82994 Hypercalciuria: Secondary | ICD-10-CM

## 2022-10-09 DIAGNOSIS — Z86010 Personal history of colonic polyps: Secondary | ICD-10-CM

## 2022-10-09 MED ORDER — BUSPIRONE HCL 10 MG PO TABS: 10.0000 mg | ORAL_TABLET | Freq: Three times a day (TID) | ORAL | 2 refills | Status: DC

## 2022-10-09 MED ORDER — ALPRAZOLAM 0.25 MG PO TABS: 0.2500 mg | ORAL_TABLET | Freq: Every evening | ORAL | 0 refills | Status: DC | PRN

## 2022-10-09 NOTE — Progress Notes (Signed)
Established Patient Office Visit  Subjective:  Patient ID: Shari Foster, female    DOB: Dec 08, 1960  Age: 62 y.o. MRN: [id number]  Chief Complaint  Patient presents with   Follow-up    Needs refills    Patient comes in for follow-up today.  She has not been in since March 2023.  Suffers from anxiety disorder and frequent panic attacks these days due to stressful situation at home.  She has been taking her Paxil 40 mg/day but thinks it is not helping her currently.  Patient is also on atenolol for palpitations and hydrochlorothiazide for hypercalciuria and history of kidney stones. Mentions that she gets this rapid heartbeat and some chest tightness especially when she is having anxiety and panic attacks.  Previously she had a prescription for Xanax to be taken only as needed but she does not have any more of those pills.  Patient agrees to try BuSpar 10 mg twice a day in addition to her Paxil.  Will still give her a few pills of Xanax to be taken only when absolutely needed. Patient had her mammogram last year.  Her colonoscopy is overdue , has personal history of colon polyps.  Will get her lab work done at next visit.    No other concerns at this time.   Past Medical History:  Diagnosis Date   Abnormal mammogram, unspecified    Anxiety    Arthritis    Chronic kidney disease    Depression    Family history of breast cancer    Family history of colon cancer    Hypercalcemia 2004   Hypertension    (pt denies)   IBS (irritable bowel syndrome) 2004   Palpitations    PONV (postoperative nausea and vomiting)    Tobacco abuse    Urinary calculus    Wears contact lenses     Past Surgical History:  Procedure Laterality Date   BILATERAL SALPINGOOPHORECTOMY  2010   bilateral mucinous cystadenomas/ Dr Harold Hedge with [facility]   BREAST BIOPSY Left    neg   CHOLECYSTECTOMY  2006   COLONOSCOPY  08/17/2013   small polyps   COLONOSCOPY WITH PROPOFOL N/A 12/26/2016   Procedure: COLONOSCOPY  WITH PROPOFOL;  Surgeon: Toney Reil, MD;  Location: [facility];  Service: Endoscopy;  Laterality: N/A;   COLPOSCOPY  2013   +HRHPV   LAPAROSCOPIC SUPRACERVICAL HYSTERECTOMY  11/23/2008   Dr Knicius-metrorrhagia   LAPAROSCOPIC TUBAL LIGATION      Social History   Socioeconomic History   Marital status: Divorced    Spouse name: Not on file   Number of children: 2   Years of education: Not on file   Highest education level: Some college, no degree  Occupational History   Not on file  Tobacco Use   Smoking status: Every Day    Current packs/day: 1.00    Average packs/day: 1 pack/day for 47.6 years (47.6 ttl pk-yrs)    Types: Cigarettes    Start date: 58   Smokeless tobacco: Never   Tobacco comments:    off and on since age 51  Vaping Use   Vaping status: Never Used  Substance and Sexual Activity   Alcohol use: No    Comment: rare   Drug use: No   Sexual activity: Not Currently  Other Topics Concern   Not on file  Social History Narrative   Not on file   Social Determinants of Health   Financial Resource Strain: Medium Risk (05/07/2017)  Overall Financial Resource Strain (CARDIA)    Difficulty of Paying Living Expenses: Somewhat hard  Food Insecurity: No Food Insecurity (05/07/2017)   Hunger Vital Sign    Worried About Running Out of Food in the Last Year: Never true    Ran Out of Food in the Last Year: Never true  Transportation Needs: No Transportation Needs (05/07/2017)   PRAPARE - Administrator, Civil Service (Medical): No    Lack of Transportation (Non-Medical): No  Physical Activity: Inactive (05/07/2017)   Exercise Vital Sign    Days of Exercise per Week: 0 days    Minutes of Exercise per Session: 0 min  Stress: Stress Concern Present (05/07/2017)   [employer] of Occupational Health - Occupational Stress Questionnaire    Feeling of Stress : Very much  Social Connections: Somewhat Isolated (05/07/2017)   Social Connection and  Isolation Panel [NHANES]    Frequency of Communication with Friends and Family: Three times a week    Frequency of Social Gatherings with Friends and Family: Not on file    Attends Religious Services: Never    Active Member of Clubs or Organizations: Yes    Attends Banker Meetings: More than 4 times per year    Marital Status: Divorced  Intimate Partner Violence: Not At Risk (05/07/2017)   Humiliation, Afraid, Rape, and Kick questionnaire    Fear of Current or Ex-Partner: No    Emotionally Abused: No    Physically Abused: No    Sexually Abused: No    Family History  Problem Relation Age of Onset   Leukemia Cousin 77   Breast cancer Paternal Grandmother 80   Schizophrenia Paternal Grandmother    Diabetes Paternal Grandmother    Colon cancer Maternal Aunt        66s   Leukemia Maternal Grandfather 74   Breast cancer Maternal Grandmother 78   Breast cancer Mother 22   Depression Mother    Anxiety disorder Mother    Diabetes Sister    Anxiety disorder Sister    Depression Sister    Schizophrenia Son    ADD / ADHD Son    Drug abuse Son    Alcohol abuse Son    Schizophrenia Father    Heart attack Father    Stroke Father    Schizophrenia Paternal Uncle    Schizophrenia Brother     Allergies  Allergen Reactions   Codeine Nausea And Vomiting    Review of Systems  Constitutional: Negative.  Negative for chills, diaphoresis, fever, malaise/fatigue and weight loss.  HENT: Negative.    Eyes: Negative.   Respiratory: Negative.  Negative for cough and shortness of breath.   Cardiovascular:  Positive for palpitations. Negative for chest pain and leg swelling.  Gastrointestinal: Negative.  Negative for abdominal pain, constipation, diarrhea, heartburn, nausea and vomiting.  Genitourinary: Negative.  Negative for dysuria and flank pain.  Musculoskeletal: Negative.  Negative for joint pain and myalgias.  Skin: Negative.   Neurological: Negative.  Negative for  dizziness and headaches.  Endo/Heme/Allergies: Negative.   Psychiatric/Behavioral:  Negative for depression and suicidal ideas. The patient is nervous/anxious.        Objective:   BP 124/76   Pulse 71   Ht 5' 8.5" (1.74 m)   Wt 193 lb 3.2 oz (87.6 kg)   SpO2 97%   BMI 28.95 kg/m   Vitals:   10/09/22 1049  BP: 124/76  Pulse: 71  Height: 5' 8.5" (1.74 m)  Weight: 193 lb 3.2 oz (87.6 kg)  SpO2: 97%  BMI (Calculated): 28.95    Physical Exam Vitals and nursing note reviewed.  Constitutional:      Appearance: Normal appearance.  HENT:     Head: Normocephalic and atraumatic.     Nose: Nose normal.     Mouth/Throat:     Mouth: Mucous membranes are moist.     Pharynx: Oropharynx is clear.  Eyes:     Conjunctiva/sclera: Conjunctivae normal.     Pupils: Pupils are equal, round, and reactive to light.  Cardiovascular:     Rate and Rhythm: Normal rate and regular rhythm.     Pulses: Normal pulses.     Heart sounds: Normal heart sounds. No murmur heard. Pulmonary:     Effort: Pulmonary effort is normal.     Breath sounds: Normal breath sounds. No wheezing.  Abdominal:     General: Bowel sounds are normal. There is no distension.     Palpations: Abdomen is soft.     Tenderness: There is no abdominal tenderness. There is no right CVA tenderness or left CVA tenderness.  Musculoskeletal:        General: Normal range of motion.     Cervical back: Normal range of motion.     Right lower leg: No edema.     Left lower leg: No edema.  Skin:    General: Skin is warm and dry.  Neurological:     General: No focal deficit present.     Mental Status: She is alert and oriented to person, place, and time.  Psychiatric:        Mood and Affect: Mood normal.        Behavior: Behavior normal.      No results found for any visits on 10/09/22.  No results found for this or any previous visit (from the past 2160 hour(s)).    Assessment & Plan:  Add BuSpar 10 mg twice a day to  Paxil.  Continue other medications.  Will get blood work at follow-up.  Will set up GI referral. Problem List Items Addressed This Visit     Hypercalciuria   GAD (generalized anxiety disorder) - Primary   Relevant Medications   busPIRone (BUSPAR) 10 MG tablet   ALPRAZolam (XANAX) 0.25 MG tablet   Palpitations    Return in about 1 month (around 11/09/2022).   Total time spent: 30 minutes  Margaretann Loveless, MD  10/09/2022   This document may have been prepared by [facility] Voice Recognition software and as such may include unintentional dictation errors.

## 2022-10-24 ENCOUNTER — Other Ambulatory Visit: Payer: Self-pay | Admitting: Family

## 2022-10-25 ENCOUNTER — Other Ambulatory Visit: Payer: Self-pay

## 2022-11-09 ENCOUNTER — Ambulatory Visit: Payer: Managed Care, Other (non HMO) | Admitting: Internal Medicine

## 2023-01-24 ENCOUNTER — Other Ambulatory Visit: Payer: Self-pay | Admitting: Internal Medicine

## 2023-01-24 DIAGNOSIS — I10 Essential (primary) hypertension: Secondary | ICD-10-CM

## 2023-02-04 ENCOUNTER — Other Ambulatory Visit: Payer: Self-pay | Admitting: Internal Medicine

## 2023-02-04 DIAGNOSIS — I10 Essential (primary) hypertension: Secondary | ICD-10-CM

## 2023-02-05 MED ORDER — HYDROCHLOROTHIAZIDE 25 MG PO TABS: 25.0000 mg | ORAL_TABLET | Freq: Every morning | ORAL | 3 refills | Status: DC

## 2023-02-05 NOTE — Addendum Note (Signed)
Addended byKatherine Mantle on: 02/05/2023 08:05 AM   Modules accepted: Orders

## 2023-04-22 ENCOUNTER — Other Ambulatory Visit: Payer: Self-pay | Admitting: Internal Medicine

## 2023-04-22 DIAGNOSIS — F32A Depression, unspecified: Secondary | ICD-10-CM

## 2023-04-29 ENCOUNTER — Other Ambulatory Visit: Payer: Self-pay | Admitting: Internal Medicine

## 2023-04-29 DIAGNOSIS — F32A Depression, unspecified: Secondary | ICD-10-CM

## 2023-05-22 ENCOUNTER — Other Ambulatory Visit: Payer: Self-pay

## 2023-05-22 DIAGNOSIS — F32A Depression, unspecified: Secondary | ICD-10-CM

## 2023-05-23 ENCOUNTER — Other Ambulatory Visit: Payer: Self-pay

## 2023-05-23 DIAGNOSIS — F32A Depression, unspecified: Secondary | ICD-10-CM

## 2023-05-24 MED ORDER — PAROXETINE HCL 40 MG PO TABS: 40.0000 mg | ORAL_TABLET | ORAL | 3 refills | Status: AC

## 2023-05-31 ENCOUNTER — Ambulatory Visit: Admitting: Internal Medicine

## 2023-05-31 ENCOUNTER — Encounter: Payer: Self-pay | Admitting: Internal Medicine

## 2023-05-31 VITALS — BP 108/64 | HR 81 | Ht 68.5 in | Wt 187.6 lb

## 2023-05-31 DIAGNOSIS — R002 Palpitations: Secondary | ICD-10-CM

## 2023-05-31 DIAGNOSIS — R1010 Upper abdominal pain, unspecified: Secondary | ICD-10-CM | POA: Diagnosis not present

## 2023-05-31 DIAGNOSIS — Z1211 Encounter for screening for malignant neoplasm of colon: Secondary | ICD-10-CM

## 2023-05-31 DIAGNOSIS — Z1231 Encounter for screening mammogram for malignant neoplasm of breast: Secondary | ICD-10-CM

## 2023-05-31 DIAGNOSIS — F17210 Nicotine dependence, cigarettes, uncomplicated: Secondary | ICD-10-CM

## 2023-05-31 DIAGNOSIS — F411 Generalized anxiety disorder: Secondary | ICD-10-CM

## 2023-05-31 DIAGNOSIS — E782 Mixed hyperlipidemia: Secondary | ICD-10-CM | POA: Diagnosis not present

## 2023-05-31 DIAGNOSIS — F32A Depression, unspecified: Secondary | ICD-10-CM

## 2023-05-31 DIAGNOSIS — Z0130 Encounter for examination of blood pressure without abnormal findings: Secondary | ICD-10-CM

## 2023-05-31 DIAGNOSIS — R82994 Hypercalciuria: Secondary | ICD-10-CM

## 2023-05-31 LAB — POCT URINALYSIS DIPSTICK
Bilirubin, UA: NEGATIVE
Glucose, UA: NEGATIVE
Ketones, UA: NEGATIVE
Leukocytes, UA: NEGATIVE
Nitrite, UA: NEGATIVE
Protein, UA: NEGATIVE
Spec Grav, UA: 1.02 (ref 1.010–1.025)
Urobilinogen, UA: 0.2 U/dL
pH, UA: 6.5 (ref 5.0–8.0)

## 2023-05-31 NOTE — Progress Notes (Signed)
 2  Established Patient Office Visit  Subjective:  Patient ID: Shari Foster, female    DOB: [birth year]  Age: 63 y.o. MRN: [id number]  Chief Complaint  Patient presents with   Follow-up    Medication refills    Patient comes in for her follow-up today.  She has not been in since August 2024.  Today she has several complaints.  Mentions crampy abdominal pain accompanied by loose bowel movements.  She does have history of irritable bowel syndrome.  Last colonoscopy was in 2018 and was advised to come back in 3 years.  Will send in a referral to GI for repeat colonoscopy.  She is due for blood work today as well as to be scheduled for mammogram.  Patient continues to smoke cigarettes and needed a CT chest for lung cancer screening.  She continues to have anxiety but currently only taking Paxil. Her hydrochlorothiazide is for calciuria and kidney stones.      No other concerns at this time.   Past Medical History:  Diagnosis Date   Abnormal mammogram, unspecified    Anxiety    Arthritis    Chronic kidney disease    Depression    Family history of breast cancer    Family history of colon cancer    Hypercalcemia 2004   Hypertension    (pt denies)   IBS (irritable bowel syndrome) 2004   Palpitations    PONV (postoperative nausea and vomiting)    Tobacco abuse    Urinary calculus    Wears contact lenses     Past Surgical History:  Procedure Laterality Date   BILATERAL SALPINGOOPHORECTOMY  2010   bilateral mucinous cystadenomas/ Dr Harold Hedge with [facility]   BREAST BIOPSY Left    neg   CHOLECYSTECTOMY  2006   COLONOSCOPY  08/17/2013   small polyps   COLONOSCOPY WITH PROPOFOL N/A 12/26/2016   Procedure: COLONOSCOPY WITH PROPOFOL;  Surgeon: Toney Reil, MD;  Location: [facility];  Service: Endoscopy;  Laterality: N/A;   COLPOSCOPY  2013   +HRHPV   LAPAROSCOPIC SUPRACERVICAL HYSTERECTOMY  11/23/2008   Dr Knicius-metrorrhagia   LAPAROSCOPIC TUBAL LIGATION      Social  History   Socioeconomic History   Marital status: Divorced    Spouse name: Not on file   Number of children: 2   Years of education: Not on file   Highest education level: Some college, no degree  Occupational History   Not on file  Tobacco Use   Smoking status: Every Day    Current packs/day: 1.00    Average packs/day: 1 pack/day for 48.2 years (48.2 ttl pk-yrs)    Types: Cigarettes    Start date: 73   Smokeless tobacco: Never   Tobacco comments:    off and on since age 3  Vaping Use   Vaping status: Never Used  Substance and Sexual Activity   Alcohol use: No    Comment: rare   Drug use: No   Sexual activity: Not Currently  Other Topics Concern   Not on file  Social History Narrative   Not on file   Social Drivers of Health   Financial Resource Strain: Medium Risk (05/07/2017)   Overall Financial Resource Strain (CARDIA)    Difficulty of Paying Living Expenses: Somewhat hard  Food Insecurity: No Food Insecurity (05/07/2017)   Hunger Vital Sign    Worried About Running Out of Food in the Last Year: Never true    Ran Out of Food  in the Last Year: Never true  Transportation Needs: No Transportation Needs (05/07/2017)   PRAPARE - Administrator, Civil Service (Medical): No    Lack of Transportation (Non-Medical): No  Physical Activity: Inactive (05/07/2017)   Exercise Vital Sign    Days of Exercise per Week: 0 days    Minutes of Exercise per Session: 0 min  Stress: Stress Concern Present (05/07/2017)   [employer] of Occupational Health - Occupational Stress Questionnaire    Feeling of Stress : Very much  Social Connections: Somewhat Isolated (05/07/2017)   Social Connection and Isolation Panel [NHANES]    Frequency of Communication with Friends and Family: Three times a week    Frequency of Social Gatherings with Friends and Family: Not on file    Attends Religious Services: Never    Active Member of Clubs or Organizations: Yes    Attends Museum/gallery exhibitions officer: More than 4 times per year    Marital Status: Divorced  Intimate Partner Violence: Not At Risk (05/07/2017)   Humiliation, Afraid, Rape, and Kick questionnaire    Fear of Current or Ex-Partner: No    Emotionally Abused: No    Physically Abused: No    Sexually Abused: No    Family History  Problem Relation Age of Onset   Leukemia Cousin 35   Breast cancer Paternal Grandmother 44   Schizophrenia Paternal Grandmother    Diabetes Paternal Grandmother    Colon cancer Maternal Aunt        72s   Leukemia Maternal Grandfather 76   Breast cancer Maternal Grandmother 33   Breast cancer Mother 53   Depression Mother    Anxiety disorder Mother    Diabetes Sister    Anxiety disorder Sister    Depression Sister    Schizophrenia Son    ADD / ADHD Son    Drug abuse Son    Alcohol abuse Son    Schizophrenia Father    Heart attack Father    Stroke Father    Schizophrenia Paternal Uncle    Schizophrenia Brother     Allergies  Allergen Reactions   Codeine Nausea And Vomiting    Outpatient Medications Prior to Visit  Medication Sig   atenolol (TENORMIN) 50 MG tablet TAKE 1 TABLET(50 MG) BY MOUTH DAILY   hydrochlorothiazide (HYDRODIURIL) 25 MG tablet Take 1 tablet (25 mg total) by mouth every morning.   PARoxetine (PAXIL) 40 MG tablet Take 1 tablet (40 mg total) by mouth every morning.   [DISCONTINUED] ALPRAZolam (XANAX) 0.25 MG tablet Take 1 tablet (0.25 mg total) by mouth at bedtime as needed for anxiety. (Patient not taking: Reported on 05/31/2023)   [DISCONTINUED] busPIRone (BUSPAR) 10 MG tablet Take 1 tablet (10 mg total) by mouth 3 (three) times daily. (Patient not taking: Reported on 05/31/2023)   No facility-administered medications prior to visit.    Review of Systems  Constitutional:  Positive for weight loss. Negative for chills, diaphoresis, fever and malaise/fatigue.  HENT: Negative.  Negative for ear discharge and sore throat.   Eyes: Negative.    Respiratory: Negative.  Negative for cough and shortness of breath.   Cardiovascular: Negative.  Negative for chest pain, palpitations and leg swelling.  Gastrointestinal:  Positive for abdominal pain, diarrhea and nausea. Negative for blood in stool, constipation, heartburn, melena and vomiting.  Genitourinary: Negative.  Negative for dysuria and flank pain.  Musculoskeletal: Negative.  Negative for joint pain and myalgias.  Skin: Negative.   Neurological:  Negative.  Negative for dizziness and headaches.  Endo/Heme/Allergies: Negative.   Psychiatric/Behavioral: Negative.  Negative for depression and suicidal ideas. The patient is not nervous/anxious.        Objective:   BP 108/64   Pulse 81   Ht 5' 8.5" (1.74 m)   Wt 187 lb 9.6 oz (85.1 kg)   SpO2 97%   BMI 28.11 kg/m   Vitals:   05/31/23 1424  BP: 108/64  Pulse: 81  Height: 5' 8.5" (1.74 m)  Weight: 187 lb 9.6 oz (85.1 kg)  SpO2: 97%  BMI (Calculated): 28.11    Physical Exam Vitals and nursing note reviewed.  Constitutional:      Appearance: Normal appearance.  HENT:     Head: Normocephalic and atraumatic.     Nose: Nose normal.     Mouth/Throat:     Mouth: Mucous membranes are moist.     Pharynx: Oropharynx is clear.  Eyes:     Conjunctiva/sclera: Conjunctivae normal.     Pupils: Pupils are equal, round, and reactive to light.  Cardiovascular:     Rate and Rhythm: Normal rate and regular rhythm.     Pulses: Normal pulses.     Heart sounds: Normal heart sounds. No murmur heard. Pulmonary:     Effort: Pulmonary effort is normal.     Breath sounds: Normal breath sounds. No wheezing.  Abdominal:     General: Bowel sounds are normal.     Palpations: Abdomen is soft.     Tenderness: There is no abdominal tenderness. There is no right CVA tenderness or left CVA tenderness.  Musculoskeletal:        General: Normal range of motion.     Cervical back: Normal range of motion.     Right lower leg: No edema.      Left lower leg: No edema.  Skin:    General: Skin is warm and dry.  Neurological:     General: No focal deficit present.     Mental Status: She is alert and oriented to person, place, and time.  Psychiatric:        Mood and Affect: Mood normal.        Behavior: Behavior normal.      Results for orders placed or performed in visit on 05/31/23  POCT Urinalysis Dipstick (81002)  Result Value Ref Range   Color, UA Yellow    Clarity, UA Cloudy    Glucose, UA Negative Negative   Bilirubin, UA Negative    Ketones, UA Negative    Spec Grav, UA 1.020 1.010 - 1.025   Blood, UA Moderate    pH, UA 6.5 5.0 - 8.0   Protein, UA Negative Negative   Urobilinogen, UA 0.2 0.2 or 1.0 E.U./dL   Nitrite, UA Negative    Leukocytes, UA Negative Negative   Appearance Cloudy    Odor Yes     Recent Results (from the past 2160 hours)  POCT Urinalysis Dipstick (96045)     Status: None   Collection Time: 05/31/23  2:46 PM  Result Value Ref Range   Color, UA Yellow    Clarity, UA Cloudy    Glucose, UA Negative Negative   Bilirubin, UA Negative    Ketones, UA Negative    Spec Grav, UA 1.020 1.010 - 1.025   Blood, UA Moderate    pH, UA 6.5 5.0 - 8.0   Protein, UA Negative Negative   Urobilinogen, UA 0.2 0.2 or 1.0 E.U./dL   Nitrite,  UA Negative    Leukocytes, UA Negative Negative   Appearance Cloudy    Odor Yes       Assessment & Plan:  Check labs today.  Referral sent to GI patient is overdue for her colonoscopy.  Order sent for mammogram and low-dose CT chest.  Check lipase today and see if she needs a CT abdomen. Problem List Items Addressed This Visit     Depression   Hypercalciuria   Relevant Orders   CMP14+EGFR   POCT Urinalysis Dipstick (96045) (Completed)   GAD (generalized anxiety disorder)   Palpitations   Other Visit Diagnoses       Mixed hyperlipidemia    -  Primary   Relevant Orders   CMP14+EGFR   Lipid Panel w/o Chol/HDL Ratio     Breast cancer screening by  mammogram       Relevant Orders   MM 3D SCREENING MAMMOGRAM BILATERAL BREAST     Pain of upper abdomen       Relevant Orders   CBC with Diff   Lipase     Cigarette nicotine dependence without complication       Relevant Orders   CBC with Diff   CT CHEST LUNG CANCER SCREENING LOW DOSE WO CONTRAST     Colon cancer screening       Relevant Orders   Ambulatory referral to Gastroenterology       Return in about 6 weeks (around 07/12/2023).   Total time spent: 30 minutes  Margaretann Loveless, MD  05/31/2023   This document may have been prepared by [facility] Voice Recognition software and as such may include unintentional dictation errors.

## 2023-06-01 LAB — CMP14+EGFR
ALT: 16 IU/L (ref 0–32)
AST: 16 IU/L (ref 0–40)
Albumin: 4.6 g/dL (ref 3.8–4.9)
Alkaline Phosphatase: 72 IU/L (ref 44–121)
BUN/Creatinine Ratio: 16 (ref 12–28)
BUN: 14 mg/dL (ref 8–27)
Bilirubin Total: 0.3 mg/dL (ref 0.0–1.2)
CO2: 25 mmol/L (ref 20–29)
Calcium: 9.8 mg/dL (ref 8.7–10.3)
Chloride: 100 mmol/L (ref 96–106)
Creatinine, Ser: 0.89 mg/dL (ref 0.57–1.00)
Globulin, Total: 2 g/dL (ref 1.5–4.5)
Glucose: 97 mg/dL (ref 70–99)
Potassium: 3.6 mmol/L (ref 3.5–5.2)
Sodium: 143 mmol/L (ref 134–144)
Total Protein: 6.6 g/dL (ref 6.0–8.5)
eGFR: 74 mL/min/1.73

## 2023-06-01 LAB — CBC WITH DIFFERENTIAL/PLATELET
Basophils Absolute: 0.1 x10E3/uL (ref 0.0–0.2)
Basos: 0 %
EOS (ABSOLUTE): 0.1 x10E3/uL (ref 0.0–0.4)
Eos: 1 %
Hematocrit: 45.7 % (ref 34.0–46.6)
Hemoglobin: 16.2 g/dL — ABNORMAL HIGH (ref 11.1–15.9)
Immature Grans (Abs): 0 x10E3/uL (ref 0.0–0.1)
Immature Granulocytes: 0 %
Lymphocytes Absolute: 4.2 x10E3/uL — ABNORMAL HIGH (ref 0.7–3.1)
Lymphs: 33 %
MCH: 33.4 pg — ABNORMAL HIGH (ref 26.6–33.0)
MCHC: 35.4 g/dL (ref 31.5–35.7)
MCV: 94 fL (ref 79–97)
Monocytes Absolute: 0.7 x10E3/uL (ref 0.1–0.9)
Monocytes: 6 %
Neutrophils Absolute: 7.5 x10E3/uL — ABNORMAL HIGH (ref 1.4–7.0)
Neutrophils: 60 %
Platelets: 244 x10E3/uL (ref 150–450)
RBC: 4.85 x10E6/uL (ref 3.77–5.28)
RDW: 12 % (ref 11.7–15.4)
WBC: 12.7 x10E3/uL — ABNORMAL HIGH (ref 3.4–10.8)

## 2023-06-01 LAB — LIPID PANEL W/O CHOL/HDL RATIO
Cholesterol, Total: 187 mg/dL (ref 100–199)
HDL: 43 mg/dL
LDL Chol Calc (NIH): 100 mg/dL — ABNORMAL HIGH (ref 0–99)
Triglycerides: 262 mg/dL — ABNORMAL HIGH (ref 0–149)
VLDL Cholesterol Cal: 44 mg/dL — ABNORMAL HIGH (ref 5–40)

## 2023-06-01 LAB — LIPASE: Lipase: 50 U/L (ref 14–72)

## 2023-06-03 ENCOUNTER — Other Ambulatory Visit: Payer: Self-pay | Admitting: Internal Medicine

## 2023-06-03 DIAGNOSIS — A09 Infectious gastroenteritis and colitis, unspecified: Secondary | ICD-10-CM

## 2023-06-03 MED ORDER — CIPROFLOXACIN HCL 500 MG PO TABS
500.0000 mg | ORAL_TABLET | Freq: Two times a day (BID) | ORAL | 0 refills | Status: AC
Start: 2023-06-03 — End: 2023-06-10

## 2023-06-03 MED ORDER — METRONIDAZOLE 500 MG PO TABS: 500.0000 mg | ORAL_TABLET | Freq: Two times a day (BID) | ORAL | 0 refills | Status: AC

## 2023-06-03 NOTE — Progress Notes (Signed)
 Increased wbc with abdominal cramps and diarrhea,for several days. Start po cipro and flagyl.

## 2023-06-04 ENCOUNTER — Telehealth: Payer: Self-pay | Admitting: Internal Medicine

## 2023-06-04 ENCOUNTER — Other Ambulatory Visit: Payer: Self-pay | Admitting: Internal Medicine

## 2023-06-04 DIAGNOSIS — F411 Generalized anxiety disorder: Secondary | ICD-10-CM

## 2023-06-04 MED ORDER — BUSPIRONE HCL 15 MG PO TABS: 15.0000 mg | ORAL_TABLET | Freq: Two times a day (BID) | ORAL | 3 refills | Status: DC

## 2023-06-04 MED ORDER — ALPRAZOLAM 0.25 MG PO TABS: 0.2500 mg | ORAL_TABLET | Freq: Every evening | ORAL | 0 refills | Status: AC | PRN

## 2023-06-04 NOTE — Telephone Encounter (Signed)
 Patient called in wanting a Rx for a pill of Xanax 0.25 mg to calm her nerves right now. States she is having quite a time with her son who has schizophrenia and she needs something to calm her down right now. Please advise.

## 2023-06-04 NOTE — Progress Notes (Signed)
 Patient notified

## 2023-06-05 ENCOUNTER — Telehealth: Payer: Self-pay | Admitting: Internal Medicine

## 2023-06-05 NOTE — Telephone Encounter (Signed)
 Patient called in stating that she started the Cipro and Flagyl last night and about 5 minutes after she took them she started itching all over. She is not having the itching anymore but wants to know if we should change the antibiotics. Please advise.

## 2023-06-07 ENCOUNTER — Other Ambulatory Visit: Payer: Self-pay

## 2023-06-07 ENCOUNTER — Telehealth: Payer: Self-pay

## 2023-06-07 DIAGNOSIS — Z860100 Personal history of colon polyps, unspecified: Secondary | ICD-10-CM

## 2023-06-07 MED ORDER — NA SULFATE-K SULFATE-MG SULF 17.5-3.13-1.6 GM/177ML PO SOLN: 1.0000 | Freq: Once | ORAL | 0 refills | Status: AC

## 2023-06-07 NOTE — Telephone Encounter (Signed)
 Gastroenterology Pre-Procedure Review  Request Date: 07/12/23 Requesting Physician: Dr. Allegra Lai  PATIENT REVIEW QUESTIONS: The patient responded to the following health history questions as indicated:    1. Are you having any GI issues? IBS 2. Do you have a personal history of Polyps? yes (last colonoscopy 12/26/16 recommended repeat in 3 years performed by Dr. Allegra Lai) 3. Do you have a family history of Colon Cancer or Polyps? yes (maternal aunt colon cancer) 4. Diabetes Mellitus? no 5. Joint replacements in the past 12 months?no 6. Major health problems in the past 3 months?no 7. Any artificial heart valves, MVP, or defibrillator?no    MEDICATIONS & ALLERGIES:    Patient reports the following regarding taking any anticoagulation/antiplatelet therapy:   Plavix, Coumadin, Eliquis, Xarelto, Lovenox, Pradaxa, Brilinta, or Effient? no Aspirin? no  Patient confirms/reports the following medications:  Current Outpatient Medications  Medication Sig Dispense Refill   ALPRAZolam (XANAX) 0.25 MG tablet Take 1 tablet (0.25 mg total) by mouth at bedtime as needed for anxiety. 20 tablet 0   atenolol (TENORMIN) 50 MG tablet TAKE 1 TABLET(50 MG) BY MOUTH DAILY 90 tablet 3   busPIRone (BUSPAR) 15 MG tablet Take 1 tablet (15 mg total) by mouth 2 (two) times daily. 60 tablet 3   ciprofloxacin (CIPRO) 500 MG tablet Take 1 tablet (500 mg total) by mouth 2 (two) times daily for 7 days. 14 tablet 0   hydrochlorothiazide (HYDRODIURIL) 25 MG tablet Take 1 tablet (25 mg total) by mouth every morning. 90 tablet 3   metroNIDAZOLE (FLAGYL) 500 MG tablet Take 1 tablet (500 mg total) by mouth 2 (two) times daily for 7 days. 14 tablet 0   PARoxetine (PAXIL) 40 MG tablet Take 1 tablet (40 mg total) by mouth every morning. 90 tablet 3   No current facility-administered medications for this visit.    Patient confirms/reports the following allergies:  Allergies  Allergen Reactions   Codeine Nausea And Vomiting     No orders of the defined types were placed in this encounter.   AUTHORIZATION INFORMATION Primary Insurance: 1D#: Group #:  Secondary Insurance: 1D#: Group #:  SCHEDULE INFORMATION: Date: 07/12/23 Time: Location: [facility]

## 2023-06-11 NOTE — Telephone Encounter (Signed)
 Patient informed.

## 2023-07-04 ENCOUNTER — Encounter: Payer: Self-pay | Admitting: Gastroenterology

## 2023-07-04 NOTE — Anesthesia Preprocedure Evaluation (Addendum)
 Anesthesia Evaluation  Patient identified by MRN, date of birth, ID band Patient awake    Reviewed: Allergy & Precautions, NPO status , Patient's Chart, lab work & pertinent test results  History of Anesthesia Complications (+) PONV and history of anesthetic complications  Airway Mallampati: III  TM Distance: <3 FB Neck ROM: full    Dental  (+) Chipped, Poor Dentition   Pulmonary COPD, Current Smoker   Pulmonary exam normal        Cardiovascular Exercise Tolerance: Good hypertension, (-) angina Normal cardiovascular exam     Neuro/Psych  PSYCHIATRIC DISORDERS      10-09-22 office note: Suffers from anxiety disorder and frequent panic attacks these days due to stressful situation at home.  She has been taking her Paxil  40 mg/day but thinks it is not helping her currently.  Patient is also on atenolol  for palpitations and hydrochlorothiazide  for hypercalciuria and history of kidney stones. Mentions that she gets this rapid heartbeat and some chest tightness especially when she is having anxiety and panic attacks.  Previously she had a prescription for Xanax  to be taken only as needed but she does not have any more of those pills.  Patient agrees to try BuSpar  10 mg twice a day in addition to her Paxil .  Will still give her a few pills of Xanax  to be taken only when absolutely needed. negative neurological ROS     GI/Hepatic negative GI ROS, Neg liver ROS,,,  Endo/Other  negative endocrine ROS    Renal/GU Renal disease  negative genitourinary   Musculoskeletal   Abdominal   Peds  Hematology negative hematology ROS (+)   Anesthesia Other Findings Past Medical History: No date: Abnormal mammogram, unspecified No date: Anxiety No date: Arthritis No date: Chronic kidney disease No date: Depression No date: Family history of breast cancer No date: Family history of colon cancer No date: Generalized anxiety disorder No  date: Generalized anxiety disorder with panic attacks 2004: Hypercalcemia No date: Hypertension     Comment:  (pt denies) 2004: IBS (irritable bowel syndrome) No date: Palpitations No date: PONV (postoperative nausea and vomiting) No date: Tobacco abuse No date: Urinary calculus No date: Wears contact lenses  Past Surgical History: 2010: BILATERAL SALPINGOOPHORECTOMY     Comment:  bilateral mucinous cystadenomas/ Dr Annye Basque with [facility] No date: BREAST BIOPSY; Left     Comment:  neg 2006: CHOLECYSTECTOMY 08/17/2013: COLONOSCOPY     Comment:  small polyps 12/26/2016: COLONOSCOPY WITH PROPOFOL ; N/A     Comment:  Procedure: COLONOSCOPY WITH PROPOFOL ;  Surgeon: Selena Daily, MD;  Location: [facility];                Service: Endoscopy;  Laterality: N/A; 2013: COLPOSCOPY     Comment:  +HRHPV 11/23/2008: LAPAROSCOPIC SUPRACERVICAL HYSTERECTOMY     Comment:  Dr Cephas Collier No date: LAPAROSCOPIC TUBAL LIGATION  BMI    Body Mass Index: 27.27 kg/m      Reproductive/Obstetrics negative OB ROS                             Anesthesia Physical Anesthesia Plan  ASA: 2  Anesthesia Plan: General   Post-op Pain Management:    Induction: Intravenous  PONV Risk Score and Plan: Propofol  infusion and TIVA  Airway Management Planned: Natural Airway and Nasal Cannula  Additional Equipment:   Intra-op Plan:  Post-operative Plan:   Informed Consent: I have reviewed the patients History and Physical, chart, labs and discussed the procedure including the risks, benefits and alternatives for the proposed anesthesia with the patient or authorized representative who has indicated his/her understanding and acceptance.     Dental Advisory Given  Plan Discussed with: Anesthesiologist, CRNA and Surgeon  Anesthesia Plan Comments: (Patient consented for risks of anesthesia including but not limited to:  - adverse reactions to  medications - risk of airway placement if required - damage to eyes, teeth, lips or other oral mucosa - nerve damage due to positioning  - sore throat or hoarseness - Damage to heart, brain, nerves, lungs, other parts of body or loss of life  Patient voiced understanding and assent.)       Anesthesia Quick Evaluation

## 2023-07-12 ENCOUNTER — Encounter
Admission: RE | Disposition: A | Discharge status: Home / Self Care | Payer: Self-pay | Source: Home / Self Care | Attending: Gastroenterology

## 2023-07-12 ENCOUNTER — Ambulatory Visit
Admission: RE | Admit: 2023-07-12 | Discharge: 2023-07-12 | Disposition: A | Discharge status: Home / Self Care | Attending: Gastroenterology | Admitting: Gastroenterology

## 2023-07-12 ENCOUNTER — Encounter: Payer: Self-pay | Admitting: Gastroenterology

## 2023-07-12 ENCOUNTER — Other Ambulatory Visit: Payer: Self-pay

## 2023-07-12 ENCOUNTER — Ambulatory Visit: Payer: Self-pay | Admitting: Anesthesiology

## 2023-07-12 ENCOUNTER — Other Ambulatory Visit: Payer: Self-pay | Admitting: Gastroenterology

## 2023-07-12 DIAGNOSIS — D124 Benign neoplasm of descending colon: Secondary | ICD-10-CM

## 2023-07-12 DIAGNOSIS — K644 Residual hemorrhoidal skin tags: Secondary | ICD-10-CM | POA: Diagnosis not present

## 2023-07-12 DIAGNOSIS — F410 Panic disorder [episodic paroxysmal anxiety] without agoraphobia: Secondary | ICD-10-CM | POA: Diagnosis not present

## 2023-07-12 DIAGNOSIS — Z860100 Personal history of colon polyps, unspecified: Secondary | ICD-10-CM

## 2023-07-12 DIAGNOSIS — Z1211 Encounter for screening for malignant neoplasm of colon: Secondary | ICD-10-CM | POA: Diagnosis present

## 2023-07-12 DIAGNOSIS — R002 Palpitations: Secondary | ICD-10-CM | POA: Diagnosis not present

## 2023-07-12 DIAGNOSIS — Z79899 Other long term (current) drug therapy: Secondary | ICD-10-CM | POA: Insufficient documentation

## 2023-07-12 DIAGNOSIS — Z860101 Personal history of adenomatous and serrated colon polyps: Secondary | ICD-10-CM

## 2023-07-12 DIAGNOSIS — J449 Chronic obstructive pulmonary disease, unspecified: Secondary | ICD-10-CM | POA: Insufficient documentation

## 2023-07-12 DIAGNOSIS — D123 Benign neoplasm of transverse colon: Secondary | ICD-10-CM | POA: Diagnosis not present

## 2023-07-12 DIAGNOSIS — I129 Hypertensive chronic kidney disease with stage 1 through stage 4 chronic kidney disease, or unspecified chronic kidney disease: Secondary | ICD-10-CM | POA: Diagnosis not present

## 2023-07-12 DIAGNOSIS — Z87442 Personal history of urinary calculi: Secondary | ICD-10-CM | POA: Insufficient documentation

## 2023-07-12 DIAGNOSIS — N189 Chronic kidney disease, unspecified: Secondary | ICD-10-CM | POA: Insufficient documentation

## 2023-07-12 DIAGNOSIS — F17210 Nicotine dependence, cigarettes, uncomplicated: Secondary | ICD-10-CM | POA: Diagnosis not present

## 2023-07-12 DIAGNOSIS — F419 Anxiety disorder, unspecified: Secondary | ICD-10-CM | POA: Insufficient documentation

## 2023-07-12 HISTORY — DX: Panic disorder (episodic paroxysmal anxiety): F41.0

## 2023-07-12 HISTORY — DX: Generalized anxiety disorder: F41.1

## 2023-07-12 HISTORY — PX: COLONOSCOPY: SHX5424

## 2023-07-12 HISTORY — PX: POLYPECTOMY: SHX149

## 2023-07-12 SURGERY — COLONOSCOPY
Anesthesia: General | Site: Rectum

## 2023-07-12 MED ORDER — LACTATED RINGERS IV SOLN: INTRAVENOUS | Status: DC

## 2023-07-12 MED ORDER — LIDOCAINE HCL (CARDIAC) PF 100 MG/5ML IV SOSY
PREFILLED_SYRINGE | INTRAVENOUS | Status: DC | PRN
  Administered 2023-07-12: 50 mg via INTRAVENOUS

## 2023-07-12 MED ORDER — SODIUM CHLORIDE 0.9 % IV SOLN: INTRAVENOUS | Status: DC

## 2023-07-12 MED ORDER — STERILE WATER FOR IRRIGATION IR SOLN
Status: DC | PRN
  Administered 2023-07-12: 100 mL
  Administered 2023-07-12: 1

## 2023-07-12 MED ORDER — PROPOFOL 10 MG/ML IV BOLUS
INTRAVENOUS | Status: DC | PRN
  Administered 2023-07-12: 50 mg via INTRAVENOUS
  Administered 2023-07-12: 80 mg via INTRAVENOUS
  Administered 2023-07-12: 30 mg via INTRAVENOUS
  Administered 2023-07-12: 40 mg via INTRAVENOUS
  Administered 2023-07-12: 100 mg via INTRAVENOUS
  Administered 2023-07-12: 50 mg via INTRAVENOUS
  Administered 2023-07-12: 100 mg via INTRAVENOUS
  Administered 2023-07-12: 50 mg via INTRAVENOUS

## 2023-07-12 MED ORDER — PROPOFOL 10 MG/ML IV BOLUS
INTRAVENOUS | Status: AC
  Filled 2023-07-12: qty 40

## 2023-07-12 SURGICAL SUPPLY — 6 items
GOWN CVR UNV OPN BCK APRN NK (MISCELLANEOUS) ×2 IMPLANT
KIT PRC NS LF DISP ENDO (KITS) ×1 IMPLANT
MANIFOLD NEPTUNE II (INSTRUMENTS) ×1 IMPLANT
SNARE COLD EXACTO (MISCELLANEOUS) IMPLANT
TRAP ETRAP POLY (MISCELLANEOUS) IMPLANT
WATER STERILE IRR 250ML POUR (IV SOLUTION) ×1 IMPLANT

## 2023-07-12 NOTE — H&P (Signed)
 Karma Oz, MD [address]  [address]  [place], [place] [address]  Main: [phone]  Fax: [phone] Pager: [phone]  Primary Care Physician:  Aisha Hove, MD Primary Gastroenterologist:  Dr. Karma Oz  Pre-Procedure History & Physical: HPI:  Shari Foster is a 63 y.o. female is here for an colonoscopy.   Past Medical History:  Diagnosis Date   Abnormal mammogram, unspecified    Anxiety    Arthritis    Chronic kidney disease    Depression    Family history of breast cancer    Family history of colon cancer    Generalized anxiety disorder    Generalized anxiety disorder with panic attacks    Hypercalcemia 2004   Hypertension    (pt denies)   IBS (irritable bowel syndrome) 2004   Palpitations    PONV (postoperative nausea and vomiting)    Tobacco abuse    Urinary calculus    Wears contact lenses     Past Surgical History:  Procedure Laterality Date   BILATERAL SALPINGOOPHORECTOMY  2010   bilateral mucinous cystadenomas/ Dr Annye Basque with [facility]   BREAST BIOPSY Left    neg   CHOLECYSTECTOMY  2006   COLONOSCOPY  08/17/2013   small polyps   COLONOSCOPY WITH PROPOFOL  N/A 12/26/2016   Procedure: COLONOSCOPY WITH PROPOFOL ;  Surgeon: Selena Daily, MD;  Location: [facility];  Service: Endoscopy;  Laterality: N/A;   COLPOSCOPY  2013   +HRHPV   LAPAROSCOPIC SUPRACERVICAL HYSTERECTOMY  11/23/2008   Dr Cephas Collier   LAPAROSCOPIC TUBAL LIGATION      Prior to Admission medications   Medication Sig Start Date End Date Taking? Authorizing Provider  ALPRAZolam  (XANAX ) 0.25 MG tablet Take 1 tablet (0.25 mg total) by mouth at bedtime as needed for anxiety. 06/04/23 06/03/24 Yes Aisha Hove, MD  Ascorbic Acid (VITAMIN C) 1000 MG tablet Take 1,000 mg by mouth daily.   Yes [provider]  atenolol  (TENORMIN ) 50 MG tablet TAKE 1 TABLET(50 MG) BY MOUTH DAILY 10/25/22  Yes Aisha Hove, MD  cholecalciferol (VITAMIN D3) 25 MCG  (1000 UNIT) tablet Take 1,000 Units by mouth daily.   Yes [provider]  hydrochlorothiazide  (HYDRODIURIL ) 25 MG tablet Take 1 tablet (25 mg total) by mouth every morning. 02/05/23  Yes Aisha Hove, MD  PARoxetine  (PAXIL ) 40 MG tablet Take 1 tablet (40 mg total) by mouth every morning. 05/24/23  Yes Aisha Hove, MD  busPIRone  (BUSPAR ) 15 MG tablet Take 1 tablet (15 mg total) by mouth 2 (two) times daily. Patient not taking: Reported on 07/04/2023 06/04/23   Aisha Hove, MD    Allergies as of 06/07/2023 - Review Complete 05/31/2023  Allergen Reaction Noted   Codeine Nausea And Vomiting 05/29/2016    Family History  Problem Relation Age of Onset   Leukemia Cousin 66   Breast cancer Paternal Grandmother 31   Schizophrenia Paternal Grandmother    Diabetes Paternal Grandmother    Colon cancer Maternal Aunt        32s   Leukemia Maternal Grandfather 75   Breast cancer Maternal Grandmother 13   Breast cancer Mother 35   Depression Mother    Anxiety disorder Mother    Diabetes Sister    Anxiety disorder Sister    Depression Sister    Schizophrenia Son    ADD / ADHD Son    Drug abuse Son    Alcohol abuse Son  Schizophrenia Father    Heart attack Father    Stroke Father    Schizophrenia Paternal Uncle    Schizophrenia Brother     Social History   Socioeconomic History   Marital status: Divorced    Spouse name: Not on file   Number of children: 2   Years of education: Not on file   Highest education level: Some college, no degree  Occupational History   Not on file  Tobacco Use   Smoking status: Every Day    Current packs/day: 1.00    Average packs/day: 1 pack/day for 48.4 years (48.4 ttl pk-yrs)    Types: Cigarettes    Start date: 56   Smokeless tobacco: Never   Tobacco comments:    off and on since age 89  Vaping Use   Vaping status: Never Used  Substance and Sexual Activity   Alcohol use: No    Comment: rare   Drug use: No   Sexual activity:  Not Currently  Other Topics Concern   Not on file  Social History Narrative   Not on file   Social Drivers of Health   Financial Resource Strain: Medium Risk (05/07/2017)   Overall Financial Resource Strain (CARDIA)    Difficulty of Paying Living Expenses: Somewhat hard  Food Insecurity: No Food Insecurity (05/07/2017)   Hunger Vital Sign    Worried About Running Out of Food in the Last Year: Never true    Ran Out of Food in the Last Year: Never true  Transportation Needs: No Transportation Needs (05/07/2017)   PRAPARE - Administrator, Civil Service (Medical): No    Lack of Transportation (Non-Medical): No  Physical Activity: Inactive (05/07/2017)   Exercise Vital Sign    Days of Exercise per Week: 0 days    Minutes of Exercise per Session: 0 min  Stress: Stress Concern Present (05/07/2017)   [employer] of Occupational Health - Occupational Stress Questionnaire    Feeling of Stress : Very much  Social Connections: Somewhat Isolated (05/07/2017)   Social Connection and Isolation Panel [NHANES]    Frequency of Communication with Friends and Family: Three times a week    Frequency of Social Gatherings with Friends and Family: Not on file    Attends Religious Services: Never    Active Member of Clubs or Organizations: Yes    Attends Engineer, structural: More than 4 times per year    Marital Status: Divorced  Intimate Partner Violence: Not At Risk (05/07/2017)   Humiliation, Afraid, Rape, and Kick questionnaire    Fear of Current or Ex-Partner: No    Emotionally Abused: No    Physically Abused: No    Sexually Abused: No    Review of Systems: See HPI, otherwise negative ROS  Physical Exam: BP 120/69   Pulse 78   Temp (!) 97.2 F (36.2 C) (Temporal)   Ht 5' 8.5" (1.74 m)   Wt 82.6 kg   SpO2 96%   BMI 27.27 kg/m  General:   Alert,  pleasant and cooperative in NAD Head:  Normocephalic and atraumatic. Neck:  Supple; no masses or thyromegaly. Lungs:   Clear throughout to auscultation.    Heart:  Regular rate and rhythm. Abdomen:  Soft, nontender and nondistended. Normal bowel sounds, without guarding, and without rebound.   Neurologic:  Alert and  oriented x4;  grossly normal neurologically.  Impression/Plan: Shari Foster is here for an colonoscopy to be performed for h/o colon adenomas  Risks, benefits, limitations, and alternatives regarding  colonoscopy have been reviewed with the patient.  Questions have been answered.  All parties agreeable.   Ellis Guys, MD  07/12/2023, 8:47 AM

## 2023-07-12 NOTE — Transfer of Care (Signed)
 Immediate Anesthesia Transfer of Care Note  Patient: Shari Foster  Procedure(s) Performed: COLONOSCOPY (Rectum) POLYPECTOMY, INTESTINE (Rectum)  Patient Location: PACU  Anesthesia Type: General  Level of Consciousness: awake, alert  and patient cooperative  Airway and Oxygen Therapy: Patient Spontanous Breathing and Patient connected to supplemental oxygen  Post-op Assessment: Post-op Vital signs reviewed, Patient's Cardiovascular Status Stable, Respiratory Function Stable, Patent Airway and No signs of Nausea or vomiting  Post-op Vital Signs: Reviewed and stable  Complications: No notable events documented.

## 2023-07-12 NOTE — Op Note (Signed)
 [facility]astroenterology Patient Name: Shari Foster Procedure Date: 07/12/2023 9:14 AM MRN: [id number] Account #: [id number] Date of Birth: 1960-08-25 Admit Type: Outpatient Age: 63 Room: [facility] OR ROOM 01 Gender: Female Note Status: Finalized Instrument Name: [id number] Procedure:             Colonoscopy Indications:           Surveillance: Personal history of adenomatous polyps                         on last colonoscopy > 5 years ago, Last colonoscopy:                         October 2018 Providers:             Selena Daily MD, MD Referring MD:          Aisha Hove, MD (Referring MD) Medicines:             General Anesthesia Complications:         No immediate complications. Estimated blood loss:                         Minimal. Procedure:             Pre-Anesthesia Assessment:                        - Prior to the procedure, a History and Physical was                         performed, and patient medications and allergies were                         reviewed. The patient is competent. The risks and                         benefits of the procedure and the sedation options and                         risks were discussed with the patient. All questions                         were answered and informed consent was obtained.                         Patient identification and proposed procedure were                         verified by the physician, the nurse, the                         anesthesiologist, the anesthetist and the technician                         in the pre-procedure area in the procedure room in the                         endoscopy suite. Mental Status Examination: alert and  oriented. Airway Examination: normal oropharyngeal                         airway and neck mobility. Respiratory Examination:                         clear to auscultation. CV Examination: normal.                         Prophylactic  Antibiotics: The patient does not require                         prophylactic antibiotics. Prior Anticoagulants: The                         patient has taken no anticoagulant or antiplatelet                         agents. ASA Grade Assessment: II - A patient with mild                         systemic disease. After reviewing the risks and                         benefits, the patient was deemed in satisfactory                         condition to undergo the procedure. The anesthesia                         plan was to use general anesthesia. Immediately prior                         to administration of medications, the patient was                         re-assessed for adequacy to receive sedatives. The                         heart rate, respiratory rate, oxygen saturations,                         blood pressure, adequacy of pulmonary ventilation, and                         response to care were monitored throughout the                         procedure. The physical status of the patient was                         re-assessed after the procedure.                        After obtaining informed consent, the colonoscope was                         passed under direct vision. Throughout the procedure,  the patient's blood pressure, pulse, and oxygen                         saturations were monitored continuously. The                         Colonoscope was introduced through the anus and                         advanced to the the cecum, identified by appendiceal                         orifice and ileocecal valve. The colonoscopy was                         performed without difficulty. The patient tolerated                         the procedure well. The quality of the bowel                         preparation was evaluated using the BBPS [facility] Bowel                         Preparation Scale) with scores of: Right Colon = 3,                          Transverse Colon = 3 and Left Colon = 3 (entire mucosa                         seen well with no residual staining, small fragments                         of stool or opaque liquid). The total BBPS score                         equals 9. The ileocecal valve, appendiceal orifice,                         and rectum were photographed. Findings:      The perianal and digital rectal examinations were normal. Pertinent       negatives include normal sphincter tone and no palpable rectal lesions.      Two sessile polyps were found in the transverse colon. The polyps were 5       to 6 mm in size. These polyps were removed with a cold snare. Resection       and retrieval were complete. Estimated blood loss was minimal.      Four sessile polyps were found in the descending colon. The polyps were       3 to 6 mm in size. These polyps were removed with a cold snare.       Resection and retrieval were complete. Estimated blood loss was minimal.      Non-bleeding external hemorrhoids were found during retroflexion. The       hemorrhoids were large. Impression:            - Two 5 to 6 mm polyps in the transverse colon,  removed with a cold snare. Resected and retrieved.                        - Four 3 to 6 mm polyps in the descending colon,                         removed with a cold snare. Resected and retrieved.                        - Non-bleeding external hemorrhoids. Recommendation:        - Discharge patient to home (with escort).                        - Resume previous diet today.                        - Continue present medications.                        - Await pathology results.                        - Repeat colonoscopy in 3 years for surveillance of                         multiple polyps. Procedure Code(s):     --- Professional ---                        [phone], Colonoscopy, flexible; with removal of                         tumor(s), polyp(s), or other lesion(s)  by snare                         technique Diagnosis Code(s):     --- Professional ---                        Z86.010, Personal history of colonic polyps                        D12.3, Benign neoplasm of transverse colon (hepatic                         flexure or splenic flexure)                        D12.4, Benign neoplasm of descending colon                        K64.4, Residual hemorrhoidal skin tags CPT copyright 2022 American Medical Association. All rights reserved. The codes documented in this report are preliminary and upon coder review may  be revised to meet current compliance requirements. Dr. Evia Hof Selena Daily MD, MD 07/12/2023 9:50:24 AM This report has been signed electronically. Number of Addenda: 0 Note Initiated On: 07/12/2023 9:14 AM Scope Withdrawal Time: 0 hours 12 minutes 36 seconds  Total Procedure Duration: 0 hours 17 minutes 50 seconds  Estimated Blood Loss:  Estimated blood loss was minimal.      [facility]

## 2023-07-12 NOTE — Anesthesia Postprocedure Evaluation (Signed)
 Anesthesia Post Note  Patient: Shari Foster  Procedure(s) Performed: COLONOSCOPY (Rectum) POLYPECTOMY, INTESTINE (Rectum)  Patient location during evaluation: PACU Anesthesia Type: General Level of consciousness: awake and alert Pain management: pain level controlled Vital Signs Assessment: post-procedure vital signs reviewed and stable Respiratory status: spontaneous breathing, nonlabored ventilation and respiratory function stable Cardiovascular status: blood pressure returned to baseline and stable Postop Assessment: no apparent nausea or vomiting Anesthetic complications: no   No notable events documented.   Last Vitals:  Vitals:   07/12/23 1000 07/12/23 1001  BP: 106/64 106/64  Pulse: 81 80  Resp: 20 18  Temp:  (!) 36.2 C  SpO2: 95% 96%    Last Pain:  Vitals:   07/12/23 1001  TempSrc:   PainSc: 0-No pain                 Portia Brittle Darris Carachure

## 2023-07-15 ENCOUNTER — Ambulatory Visit
Admission: RE | Admit: 2023-07-15 | Discharge: 2023-07-15 | Disposition: A | Discharge status: Home / Self Care | Source: Ambulatory Visit | Attending: Internal Medicine | Admitting: Internal Medicine

## 2023-07-15 DIAGNOSIS — F17210 Nicotine dependence, cigarettes, uncomplicated: Secondary | ICD-10-CM | POA: Insufficient documentation

## 2023-07-16 LAB — ANATOMIC PATHOLOGY REPORT

## 2023-07-17 ENCOUNTER — Ambulatory Visit: Payer: Self-pay | Admitting: Gastroenterology

## 2023-07-19 ENCOUNTER — Ambulatory Visit (INDEPENDENT_AMBULATORY_CARE_PROVIDER_SITE_OTHER): Admitting: Internal Medicine

## 2023-07-19 ENCOUNTER — Encounter: Payer: Self-pay | Admitting: Internal Medicine

## 2023-07-19 VITALS — BP 108/78 | HR 69 | Ht 68.5 in | Wt 189.4 lb

## 2023-07-19 DIAGNOSIS — F32A Depression, unspecified: Secondary | ICD-10-CM

## 2023-07-19 DIAGNOSIS — E782 Mixed hyperlipidemia: Secondary | ICD-10-CM | POA: Diagnosis not present

## 2023-07-19 DIAGNOSIS — F411 Generalized anxiety disorder: Secondary | ICD-10-CM | POA: Diagnosis not present

## 2023-07-19 DIAGNOSIS — Z0130 Encounter for examination of blood pressure without abnormal findings: Secondary | ICD-10-CM

## 2023-07-19 DIAGNOSIS — D72829 Elevated white blood cell count, unspecified: Secondary | ICD-10-CM

## 2023-07-19 DIAGNOSIS — F17210 Nicotine dependence, cigarettes, uncomplicated: Secondary | ICD-10-CM

## 2023-07-19 LAB — CBC WITH DIFFERENTIAL/PLATELET
Basophils Absolute: 0 10*3/uL (ref 0.0–0.2)
Basos: 0 %
EOS (ABSOLUTE): 0.1 10*3/uL (ref 0.0–0.4)
Eos: 1 %
Hematocrit: 44 % (ref 34.0–46.6)
Hemoglobin: 15.4 g/dL (ref 11.1–15.9)
Immature Grans (Abs): 0 10*3/uL (ref 0.0–0.1)
Immature Granulocytes: 0 %
Lymphocytes Absolute: 3.9 10*3/uL — ABNORMAL HIGH (ref 0.7–3.1)
Lymphs: 33 %
MCH: 32.6 pg (ref 26.6–33.0)
MCHC: 35 g/dL (ref 31.5–35.7)
MCV: 93 fL (ref 79–97)
Monocytes Absolute: 0.8 10*3/uL (ref 0.1–0.9)
Monocytes: 7 %
Neutrophils Absolute: 7 10*3/uL (ref 1.4–7.0)
Neutrophils: 59 %
Platelets: 245 10*3/uL (ref 150–450)
RBC: 4.73 x10E6/uL (ref 3.77–5.28)
RDW: 11.7 % (ref 11.7–15.4)
WBC: 11.9 10*3/uL — ABNORMAL HIGH (ref 3.4–10.8)

## 2023-07-19 NOTE — Progress Notes (Signed)
 Established Patient Office Visit  Subjective:  Patient ID: Shari Foster, female    DOB: 09-Feb-1961  Age: 63 y.o. MRN: [id number]  Chief Complaint  Patient presents with   Follow-up    6 week follow up    Patient comes in for her follow-up today.  She is generally feeling well and has no new complaints.  She had a low-dose CT chest screening for lung cancer, but report is not available yet.  She continues to smoke cigarettes but will try to cut down.  Patient also has a history of colon polyps and had a colonoscopy.  Multiple polyps were found and advised to return in 3 years for repeat colonoscopy.  Mammogram is still pending. At her last visit her WBC was elevated and she was also suffering from an infectious diarrhea.  She completed a course of Cipro  but was not able to take metronidazole .  She does not have any further diarrhea or abdominal complaints.  Will repeat her CBC today.  Her triglycerides were also elevated, patient was advised to strict diet control.  Will repeat lipid profile at next visit.    No other concerns at this time.   Past Medical History:  Diagnosis Date   Abnormal mammogram, unspecified    Anxiety    Arthritis    Chronic kidney disease    Depression    Family history of breast cancer    Family history of colon cancer    Generalized anxiety disorder    Generalized anxiety disorder with panic attacks    Hypercalcemia 2004   Hypertension    (pt denies)   IBS (irritable bowel syndrome) 2004   Palpitations    PONV (postoperative nausea and vomiting)    Tobacco abuse    Urinary calculus    Wears contact lenses     Past Surgical History:  Procedure Laterality Date   BILATERAL SALPINGOOPHORECTOMY  2010   bilateral mucinous cystadenomas/ Dr Annye Basque with [facility]   BREAST BIOPSY Left    neg   CHOLECYSTECTOMY  2006   COLONOSCOPY  08/17/2013   small polyps   COLONOSCOPY N/A 07/12/2023   Procedure: COLONOSCOPY;  Surgeon: Selena Daily, MD;  Location:  [facility];  Service: Endoscopy;  Laterality: N/A;   COLONOSCOPY WITH PROPOFOL  N/A 12/26/2016   Procedure: COLONOSCOPY WITH PROPOFOL ;  Surgeon: Selena Daily, MD;  Location: [facility];  Service: Endoscopy;  Laterality: N/A;   COLPOSCOPY  2013   +HRHPV   LAPAROSCOPIC SUPRACERVICAL HYSTERECTOMY  11/23/2008   Dr Cephas Collier   LAPAROSCOPIC TUBAL LIGATION     POLYPECTOMY N/A 07/12/2023   Procedure: POLYPECTOMY, INTESTINE;  Surgeon: Selena Daily, MD;  Location: [facility];  Service: Endoscopy;  Laterality: N/A;    Social History   Socioeconomic History   Marital status: Divorced    Spouse name: Not on file   Number of children: 2   Years of education: Not on file   Highest education level: Some college, no degree  Occupational History   Not on file  Tobacco Use   Smoking status: Every Day    Current packs/day: 1.00    Average packs/day: 1 pack/day for 48.4 years (48.4 ttl pk-yrs)    Types: Cigarettes    Start date: 25   Smokeless tobacco: Never   Tobacco comments:    off and on since age 89  Vaping Use   Vaping status: Never Used  Substance and Sexual Activity   Alcohol use: No  Comment: rare   Drug use: No   Sexual activity: Not Currently  Other Topics Concern   Not on file  Social History Narrative   Not on file   Social Drivers of Health   Financial Resource Strain: Medium Risk (05/07/2017)   Overall Financial Resource Strain (CARDIA)    Difficulty of Paying Living Expenses: Somewhat hard  Food Insecurity: No Food Insecurity (05/07/2017)   Hunger Vital Sign    Worried About Running Out of Food in the Last Year: Never true    Ran Out of Food in the Last Year: Never true  Transportation Needs: No Transportation Needs (05/07/2017)   PRAPARE - Administrator, Civil Service (Medical): No    Lack of Transportation (Non-Medical): No  Physical Activity: Inactive (05/07/2017)   Exercise Vital Sign    Days of Exercise  per Week: 0 days    Minutes of Exercise per Session: 0 min  Stress: Stress Concern Present (05/07/2017)   [employer] of Occupational Health - Occupational Stress Questionnaire    Feeling of Stress : Very much  Social Connections: Somewhat Isolated (05/07/2017)   Social Connection and Isolation Panel [NHANES]    Frequency of Communication with Friends and Family: Three times a week    Frequency of Social Gatherings with Friends and Family: Not on file    Attends Religious Services: Never    Active Member of Clubs or Organizations: Yes    Attends Engineer, structural: More than 4 times per year    Marital Status: Divorced  Intimate Partner Violence: Not At Risk (05/07/2017)   Humiliation, Afraid, Rape, and Kick questionnaire    Fear of Current or Ex-Partner: No    Emotionally Abused: No    Physically Abused: No    Sexually Abused: No    Family History  Problem Relation Age of Onset   Leukemia Cousin 37   Breast cancer Paternal Grandmother 34   Schizophrenia Paternal Grandmother    Diabetes Paternal Grandmother    Colon cancer Maternal Aunt        47s   Leukemia Maternal Grandfather 57   Breast cancer Maternal Grandmother 29   Breast cancer Mother 44   Depression Mother    Anxiety disorder Mother    Diabetes Sister    Anxiety disorder Sister    Depression Sister    Schizophrenia Son    ADD / ADHD Son    Drug abuse Son    Alcohol abuse Son    Schizophrenia Father    Heart attack Father    Stroke Father    Schizophrenia Paternal Uncle    Schizophrenia Brother     Allergies  Allergen Reactions   Codeine Nausea And Vomiting   Flagyl  [Metronidazole ] Itching    Outpatient Medications Prior to Visit  Medication Sig   Ascorbic Acid (VITAMIN C) 1000 MG tablet Take 1,000 mg by mouth daily.   atenolol  (TENORMIN ) 50 MG tablet TAKE 1 TABLET(50 MG) BY MOUTH DAILY   cholecalciferol (VITAMIN D3) 25 MCG (1000 UNIT) tablet Take 1,000 Units by mouth daily.    hydrochlorothiazide  (HYDRODIURIL ) 25 MG tablet Take 1 tablet (25 mg total) by mouth every morning.   PARoxetine  (PAXIL ) 40 MG tablet Take 1 tablet (40 mg total) by mouth every morning.   ALPRAZolam  (XANAX ) 0.25 MG tablet Take 1 tablet (0.25 mg total) by mouth at bedtime as needed for anxiety. (Patient not taking: Reported on 07/19/2023)   No facility-administered medications prior to visit.  Review of Systems  Constitutional: Negative.  Negative for chills, diaphoresis, fever, malaise/fatigue and weight loss.  HENT: Negative.  Negative for congestion and sore throat.   Eyes: Negative.   Respiratory: Negative.  Negative for cough and shortness of breath.   Cardiovascular: Negative.  Negative for chest pain, palpitations and leg swelling.  Gastrointestinal: Negative.  Negative for abdominal pain, blood in stool, constipation, diarrhea, heartburn, melena, nausea and vomiting.  Genitourinary: Negative.  Negative for dysuria and flank pain.  Musculoskeletal: Negative.  Negative for joint pain and myalgias.  Skin: Negative.   Neurological: Negative.  Negative for dizziness, tingling, tremors and headaches.  Endo/Heme/Allergies: Negative.   Psychiatric/Behavioral: Negative.  Negative for depression and suicidal ideas. The patient is not nervous/anxious.        Objective:   BP 108/78   Pulse 69   Ht 5' 8.5" (1.74 m)   Wt 189 lb 6.4 oz (85.9 kg)   SpO2 98%   BMI 28.38 kg/m   Vitals:   07/19/23 1421  BP: 108/78  Pulse: 69  Height: 5' 8.5" (1.74 m)  Weight: 189 lb 6.4 oz (85.9 kg)  SpO2: 98%  BMI (Calculated): 28.38    Physical Exam Vitals and nursing note reviewed.  Constitutional:      Appearance: Normal appearance.  HENT:     Head: Normocephalic and atraumatic.     Nose: Nose normal.     Mouth/Throat:     Mouth: Mucous membranes are moist.     Pharynx: Oropharynx is clear.  Eyes:     Conjunctiva/sclera: Conjunctivae normal.     Pupils: Pupils are equal, round, and  reactive to light.  Cardiovascular:     Rate and Rhythm: Normal rate and regular rhythm.     Pulses: Normal pulses.     Heart sounds: Normal heart sounds. No murmur heard. Pulmonary:     Effort: Pulmonary effort is normal.     Breath sounds: Normal breath sounds. No wheezing.  Abdominal:     General: Bowel sounds are normal.     Palpations: Abdomen is soft.     Tenderness: There is no abdominal tenderness. There is no right CVA tenderness or left CVA tenderness.  Musculoskeletal:        General: Normal range of motion.     Cervical back: Normal range of motion.     Right lower leg: No edema.     Left lower leg: No edema.  Skin:    General: Skin is warm and dry.  Neurological:     General: No focal deficit present.     Mental Status: She is alert and oriented to person, place, and time.  Psychiatric:        Mood and Affect: Mood normal.        Behavior: Behavior normal.      No results found for any visits on 07/19/23.  Recent Results (from the past 2160 hours)  POCT Urinalysis Dipstick (91478)     Status: None   Collection Time: 05/31/23  2:46 PM  Result Value Ref Range   Color, UA Yellow    Clarity, UA Cloudy    Glucose, UA Negative Negative   Bilirubin, UA Negative    Ketones, UA Negative    Spec Grav, UA 1.020 1.010 - 1.025   Blood, UA Moderate    pH, UA 6.5 5.0 - 8.0   Protein, UA Negative Negative   Urobilinogen, UA 0.2 0.2 or 1.0 E.U./dL   Nitrite, UA Negative  Leukocytes, UA Negative Negative   Appearance Cloudy    Odor Yes   CBC with Diff     Status: Abnormal   Collection Time: 05/31/23  3:05 PM  Result Value Ref Range   WBC 12.7 (H) 3.4 - 10.8 x10E3/uL   RBC 4.85 3.77 - 5.28 x10E6/uL   Hemoglobin 16.2 (H) 11.1 - 15.9 g/dL   Hematocrit 40.9 81.1 - 46.6 %   MCV 94 79 - 97 fL   MCH 33.4 (H) 26.6 - 33.0 pg   MCHC 35.4 31.5 - 35.7 g/dL   RDW 91.4 78.2 - 95.6 %   Platelets 244 150 - 450 x10E3/uL   Neutrophils 60 Not Estab. %   Lymphs 33 Not Estab. %    Monocytes 6 Not Estab. %   Eos 1 Not Estab. %   Basos 0 Not Estab. %   Neutrophils Absolute 7.5 (H) 1.4 - 7.0 x10E3/uL   Lymphocytes Absolute 4.2 (H) 0.7 - 3.1 x10E3/uL   Monocytes Absolute 0.7 0.1 - 0.9 x10E3/uL   EOS (ABSOLUTE) 0.1 0.0 - 0.4 x10E3/uL   Basophils Absolute 0.1 0.0 - 0.2 x10E3/uL   Immature Granulocytes 0 Not Estab. %   Immature Grans (Abs) 0.0 0.0 - 0.1 x10E3/uL  CMP14+EGFR     Status: None   Collection Time: 05/31/23  3:05 PM  Result Value Ref Range   Glucose 97 70 - 99 mg/dL   BUN 14 8 - 27 mg/dL   Creatinine, Ser 2.13 0.57 - 1.00 mg/dL   eGFR 74 >08 MV/HQI/6.96   BUN/Creatinine Ratio 16 12 - 28   Sodium 143 134 - 144 mmol/L   Potassium 3.6 3.5 - 5.2 mmol/L   Chloride 100 96 - 106 mmol/L   CO2 25 20 - 29 mmol/L   Calcium 9.8 8.7 - 10.3 mg/dL   Total Protein 6.6 6.0 - 8.5 g/dL   Albumin 4.6 3.8 - 4.9 g/dL   Globulin, Total 2.0 1.5 - 4.5 g/dL   Bilirubin Total 0.3 0.0 - 1.2 mg/dL   Alkaline Phosphatase 72 44 - 121 IU/L   AST 16 0 - 40 IU/L   ALT 16 0 - 32 IU/L  Lipid Panel w/o Chol/HDL Ratio     Status: Abnormal   Collection Time: 05/31/23  3:05 PM  Result Value Ref Range   Cholesterol, Total 187 100 - 199 mg/dL   Triglycerides 295 (H) 0 - 149 mg/dL   HDL 43 >28 mg/dL   VLDL Cholesterol Cal 44 (H) 5 - 40 mg/dL   LDL Chol Calc (NIH) 413 (H) 0 - 99 mg/dL  Lipase     Status: None   Collection Time: 05/31/23  3:05 PM  Result Value Ref Range   Lipase 50 14 - 72 U/L  Anatomic Pathology Report     Status: None   Collection Time: 07/12/23 12:00 AM  Result Value Ref Range   Diagnosis synopsis: Comment     Comment: Part A-Transverse,Colon Biopsy: TUBULAR ADENOMA. Part B-Descending,Colon Biopsy: TUBULAR ADENOMA.    Specimen: Comment     Comment: Part A-Transverse,Colon Biopsy Part B-Descending,Colon Biopsy    Diagnosis: Comment     Comment: Part A-TUBULAR ADENOMA. Part B-TUBULAR ADENOMA.    Gross description: Comment     Comment: Part A-The specimen  is received in formalin labeled "Koral, Tonilynn, transverse colon polyp ". Received are in aggregate fragments of tan, friable tissue measuring 1.0 x 1.0 x 0.2 cm. The specimen is submitted entirely in cassette 1. Part B-The  specimen is received in formalin labeled "Heist, Sheridyn, descending colon polyp ". Received are in aggregate fragments of tan, soft tissue measuring 1.5 x 1.0 x 0.2 cm. The specimen is submitted entirely in cassette 1.    Electronically signed by: Comment     Comment: Rosalene Colon, MD, Pathologist   CPT code(s): Comment     Comment: [id number]    CPT Disclaimer: Comment     Comment: CPT codes, as published by the AMA, are provided for informational purpose only as the assignment of the CPT codes is the responsibility of the billing party.    Pathologist provided ICD: Comment     Comment: Part A-D13.9 Part B-D13.9       Assessment & Plan:  Continue current medications.  Strict diet control for high triglycerides.  Advised to cut down on her cigarette smoking.  Check CBC today. Problem List Items Addressed This Visit     Depression   GAD (generalized anxiety disorder)   Other Visit Diagnoses       Mixed hyperlipidemia    -  Primary     Leukocytosis, unspecified type       Relevant Orders   CBC with Diff     Cigarette nicotine dependence without complication           Return in about 4 months (around 11/19/2023).   Total time spent: 30 minutes  Aisha Hove, MD  07/19/2023   This document may have been prepared by [facility] Voice Recognition software and as such may include unintentional dictation errors.

## 2023-07-22 ENCOUNTER — Ambulatory Visit: Payer: Self-pay | Admitting: Internal Medicine

## 2023-07-22 NOTE — Progress Notes (Signed)
 Patient notified

## 2023-08-01 ENCOUNTER — Encounter: Payer: Self-pay | Admitting: Gastroenterology

## 2023-08-08 ENCOUNTER — Ambulatory Visit: Payer: Self-pay | Admitting: Internal Medicine

## 2023-08-20 ENCOUNTER — Ambulatory Visit
Admission: RE | Admit: 2023-08-20 | Discharge: 2023-08-20 | Disposition: A | Discharge status: Home / Self Care | Source: Ambulatory Visit | Attending: Internal Medicine | Admitting: Internal Medicine

## 2023-08-20 DIAGNOSIS — Z1231 Encounter for screening mammogram for malignant neoplasm of breast: Secondary | ICD-10-CM | POA: Diagnosis present

## 2023-10-23 ENCOUNTER — Other Ambulatory Visit: Payer: Self-pay | Admitting: Internal Medicine

## 2023-11-19 ENCOUNTER — Ambulatory Visit: Admitting: Internal Medicine

## 2023-11-19 ENCOUNTER — Encounter: Payer: Self-pay | Admitting: Internal Medicine

## 2023-11-19 VITALS — BP 118/78 | HR 76 | Ht 68.5 in | Wt 194.0 lb

## 2023-11-19 DIAGNOSIS — E782 Mixed hyperlipidemia: Secondary | ICD-10-CM | POA: Diagnosis not present

## 2023-11-19 DIAGNOSIS — F411 Generalized anxiety disorder: Secondary | ICD-10-CM

## 2023-11-19 DIAGNOSIS — F17210 Nicotine dependence, cigarettes, uncomplicated: Secondary | ICD-10-CM | POA: Insufficient documentation

## 2023-11-19 DIAGNOSIS — F3289 Other specified depressive episodes: Secondary | ICD-10-CM

## 2023-11-19 DIAGNOSIS — I10 Essential (primary) hypertension: Secondary | ICD-10-CM | POA: Diagnosis not present

## 2023-11-19 DIAGNOSIS — R002 Palpitations: Secondary | ICD-10-CM

## 2023-11-19 DIAGNOSIS — Z124 Encounter for screening for malignant neoplasm of cervix: Secondary | ICD-10-CM

## 2023-11-19 DIAGNOSIS — Z1151 Encounter for screening for human papillomavirus (HPV): Secondary | ICD-10-CM

## 2023-11-19 DIAGNOSIS — Z1272 Encounter for screening for malignant neoplasm of vagina: Secondary | ICD-10-CM

## 2023-11-19 NOTE — Progress Notes (Signed)
 Established Patient Office Visit  Subjective:  Patient ID: Shari Foster, female    DOB: 12/10/1960  Age: 63 y.o. MRN: [id number]  Chief Complaint  Patient presents with   Follow-up    4 month follow up    Patient comes in for her follow up today. She has completed her mammogram, colonoscopy. Needs a PAP today. She is s/p supracervical hysterectomy, BSO- and has been HPV positive once in the past.  Generally feels well, except for anxiety. Taking Paxl regularly. Will return for fasting labs.    No other concerns at this time.   Past Medical History:  Diagnosis Date   Abnormal mammogram, unspecified    Anxiety    Arthritis    Chronic kidney disease    Depression    Family history of breast cancer    Family history of colon cancer    Generalized anxiety disorder    Generalized anxiety disorder with panic attacks    Hypercalcemia 2004   Hypertension    (pt denies)   IBS (irritable bowel syndrome) 2004   Palpitations    PONV (postoperative nausea and vomiting)    Tobacco abuse    Urinary calculus    Wears contact lenses     Past Surgical History:  Procedure Laterality Date   BILATERAL SALPINGOOPHORECTOMY  2010   bilateral mucinous cystadenomas/ Dr Hazel with [facility]   BREAST BIOPSY Left    neg   CHOLECYSTECTOMY  2006   COLONOSCOPY  08/17/2013   small polyps   COLONOSCOPY N/A 07/12/2023   Procedure: COLONOSCOPY;  Surgeon: Unk Corinn Skiff, MD;  Location: [facility];  Service: Endoscopy;  Laterality: N/A;   COLONOSCOPY WITH PROPOFOL  N/A 12/26/2016   Procedure: COLONOSCOPY WITH PROPOFOL ;  Surgeon: Unk Corinn Skiff, MD;  Location: [facility];  Service: Endoscopy;  Laterality: N/A;   COLPOSCOPY  2013   +HRHPV   LAPAROSCOPIC SUPRACERVICAL HYSTERECTOMY  11/23/2008   Dr Harry   LAPAROSCOPIC TUBAL LIGATION     POLYPECTOMY N/A 07/12/2023   Procedure: POLYPECTOMY, INTESTINE;  Surgeon: Unk Corinn Skiff, MD;  Location: [facility];  Service: Endoscopy;  Laterality: N/A;    Social History   Socioeconomic History   Marital status: Divorced    Spouse name: Not on file   Number of children: 2   Years of education: Not on file   Highest education level: Some college, no degree  Occupational History   Not on file  Tobacco Use   Smoking status: Every Day    Current packs/day: 1.00    Average packs/day: 1 pack/day for 48.7 years (48.7 ttl pk-yrs)    Types: Cigarettes    Start date: 60   Smokeless tobacco: Never   Tobacco comments:    off and on since age 69  Vaping Use   Vaping status: Never Used  Substance and Sexual Activity   Alcohol use: No    Comment: rare   Drug use: No   Sexual activity: Not Currently  Other Topics Concern   Not on file  Social History Narrative   Not on file   Social Drivers of Health   Financial Resource Strain: Medium Risk (05/07/2017)   Overall Financial Resource Strain (CARDIA)    Difficulty of Paying Living Expenses: Somewhat hard  Food Insecurity: No Food Insecurity (05/07/2017)   Hunger Vital Sign    Worried About Running Out of Food in the Last Year: Never true    Ran Out of Food in the  Last Year: Never true  Transportation Needs: No Transportation Needs (05/07/2017)   PRAPARE - Administrator, Civil Service (Medical): No    Lack of Transportation (Non-Medical): No  Physical Activity: Inactive (05/07/2017)   Exercise Vital Sign    Days of Exercise per Week: 0 days    Minutes of Exercise per Session: 0 min  Stress: Stress Concern Present (05/07/2017)   [employer] of Occupational Health - Occupational Stress Questionnaire    Feeling of Stress : Very much  Social Connections: Somewhat Isolated (05/07/2017)   Social Connection and Isolation Panel    Frequency of Communication with Friends and Family: Three times a week    Frequency of Social Gatherings with Friends and Family: Not on file    Attends Religious Services: Never    Active Member of Clubs  or Organizations: Yes    Attends Engineer, structural: More than 4 times per year    Marital Status: Divorced  Intimate Partner Violence: Not At Risk (05/07/2017)   Humiliation, Afraid, Rape, and Kick questionnaire    Fear of Current or Ex-Partner: No    Emotionally Abused: No    Physically Abused: No    Sexually Abused: No    Family History  Problem Relation Age of Onset   Leukemia Cousin 18   Breast cancer Paternal Grandmother 80   Schizophrenia Paternal Grandmother    Diabetes Paternal Grandmother    Colon cancer Maternal Aunt        6s   Leukemia Maternal Grandfather 65   Breast cancer Maternal Grandmother 52   Breast cancer Mother 79   Depression Mother    Anxiety disorder Mother    Diabetes Sister    Anxiety disorder Sister    Depression Sister    Schizophrenia Son    ADD / ADHD Son    Drug abuse Son    Alcohol abuse Son    Schizophrenia Father    Heart attack Father    Stroke Father    Schizophrenia Paternal Uncle    Schizophrenia Brother     Allergies  Allergen Reactions   Codeine Nausea And Vomiting   Flagyl  [Metronidazole ] Itching    Outpatient Medications Prior to Visit  Medication Sig   Ascorbic Acid (VITAMIN C) 1000 MG tablet Take 1,000 mg by mouth daily.   atenolol  (TENORMIN ) 50 MG tablet TAKE 1 TABLET(50 MG) BY MOUTH DAILY   cholecalciferol (VITAMIN D3) 25 MCG (1000 UNIT) tablet Take 1,000 Units by mouth daily.   hydrochlorothiazide  (HYDRODIURIL ) 25 MG tablet Take 1 tablet (25 mg total) by mouth every morning.   PARoxetine  (PAXIL ) 40 MG tablet Take 1 tablet (40 mg total) by mouth every morning.   ALPRAZolam  (XANAX ) 0.25 MG tablet Take 1 tablet (0.25 mg total) by mouth at bedtime as needed for anxiety. (Patient not taking: Reported on 11/19/2023)   No facility-administered medications prior to visit.    Review of Systems  Constitutional: Negative.  Negative for chills, fever and malaise/fatigue.  HENT: Negative.  Negative for congestion  and sore throat.   Eyes: Negative.  Negative for blurred vision and pain.  Respiratory: Negative.  Negative for cough and shortness of breath.   Cardiovascular: Negative.  Negative for chest pain, palpitations and leg swelling.  Gastrointestinal: Negative.  Negative for abdominal pain, blood in stool, constipation, diarrhea, heartburn, melena, nausea and vomiting.  Genitourinary: Negative.  Negative for dysuria, flank pain, frequency and urgency.  Musculoskeletal: Negative.  Negative for joint pain and  myalgias.  Skin: Negative.   Neurological: Negative.  Negative for dizziness, tingling, sensory change, weakness and headaches.  Endo/Heme/Allergies: Negative.   Psychiatric/Behavioral: Negative.  Negative for depression and suicidal ideas. The patient is not nervous/anxious.        Objective:   BP 118/78   Pulse 76   Ht 5' 8.5 (1.74 m)   Wt 194 lb (88 kg)   SpO2 96%   BMI 29.07 kg/m   Vitals:   11/19/23 1458  BP: 118/78  Pulse: 76  Height: 5' 8.5 (1.74 m)  Weight: 194 lb (88 kg)  SpO2: 96%  BMI (Calculated): 29.07    Physical Exam Vitals and nursing note reviewed. Exam conducted with a chaperone present.  Constitutional:      Appearance: Normal appearance.  HENT:     Head: Normocephalic and atraumatic.     Nose: Nose normal.     Mouth/Throat:     Mouth: Mucous membranes are moist.     Pharynx: Oropharynx is clear.  Eyes:     Conjunctiva/sclera: Conjunctivae normal.     Pupils: Pupils are equal, round, and reactive to light.  Cardiovascular:     Rate and Rhythm: Normal rate and regular rhythm.     Pulses: Normal pulses.     Heart sounds: Normal heart sounds. No murmur heard. Pulmonary:     Effort: Pulmonary effort is normal.     Breath sounds: Normal breath sounds. No wheezing.  Chest:  Breasts:    Right: Normal. No swelling, bleeding, inverted nipple, mass, nipple discharge, skin change or tenderness.     Left: Normal. No swelling, bleeding, inverted nipple,  mass, nipple discharge, skin change or tenderness.  Abdominal:     General: Bowel sounds are normal.     Palpations: Abdomen is soft.     Tenderness: There is no abdominal tenderness. There is no right CVA tenderness or left CVA tenderness.     Hernia: There is no hernia in the left inguinal area or right inguinal area.  Genitourinary:    General: Normal vulva.     Pubic Area: No rash or pubic lice.      Labia:        Right: No rash, tenderness, lesion or injury.        Left: No rash, tenderness, lesion or injury.      Urethra: No prolapse.     Vagina: Normal. No signs of injury and foreign body. No vaginal discharge, erythema, tenderness, bleeding, lesions or prolapsed vaginal walls.     Cervix: Normal. No cervical motion tenderness, discharge, friability, lesion, erythema, cervical bleeding or eversion.  Musculoskeletal:        General: Normal range of motion.     Cervical back: Normal range of motion.     Right lower leg: No edema.     Left lower leg: No edema.  Lymphadenopathy:     Upper Body:     Right upper body: No supraclavicular, axillary or pectoral adenopathy.     Left upper body: No supraclavicular, axillary or pectoral adenopathy.     Lower Body: No right inguinal adenopathy. No left inguinal adenopathy.  Skin:    General: Skin is warm and dry.  Neurological:     General: No focal deficit present.     Mental Status: She is alert and oriented to person, place, and time.  Psychiatric:        Mood and Affect: Mood normal.        Behavior: Behavior  normal.      No results found for any visits on 11/19/23.  No results found for this or any previous visit (from the past 2160 hours).    Assessment & Plan:  Continue current meds. Return for fasting labs. Problem List Items Addressed This Visit     Depression   Essential hypertension, benign   GAD (generalized anxiety disorder)   Palpitations   Relevant Orders   CBC with Diff   Mixed hyperlipidemia   Relevant  Orders   CMP14+EGFR   Lipid Panel w/o Chol/HDL Ratio   Cigarette nicotine dependence without complication   Other Visit Diagnoses       Vaginal Pap smear       Relevant Orders   IGP, Aptima HPV     Screening for human papillomavirus (HPV)       Relevant Orders   IGP, Aptima HPV     Screening for cervical cancer       Relevant Orders   IGP, Aptima HPV       Follow up 3 months.  Total time spent: 30 minutes  FERNAND FREDY RAMAN, MD  11/19/2023   This document may have been prepared by [facility] Voice Recognition software and as such may include unintentional dictation errors.

## 2023-11-21 LAB — IGP, APTIMA HPV
HPV Aptima: NEGATIVE
PAP Smear Comment: 0

## 2023-11-22 ENCOUNTER — Ambulatory Visit: Payer: Self-pay | Admitting: Internal Medicine

## 2023-11-26 NOTE — Progress Notes (Signed)
 Patient notified

## 2024-01-21 ENCOUNTER — Other Ambulatory Visit: Payer: Self-pay | Admitting: Internal Medicine

## 2024-01-21 DIAGNOSIS — I10 Essential (primary) hypertension: Secondary | ICD-10-CM

## 2024-02-18 ENCOUNTER — Ambulatory Visit: Admitting: Internal Medicine

## 2024-03-20 ENCOUNTER — Encounter: Payer: Self-pay | Admitting: Internal Medicine

## 2024-03-20 ENCOUNTER — Ambulatory Visit: Admitting: Internal Medicine

## 2024-03-20 VITALS — BP 112/72 | HR 70 | Ht 68.5 in | Wt 190.6 lb

## 2024-03-20 DIAGNOSIS — F411 Generalized anxiety disorder: Secondary | ICD-10-CM | POA: Diagnosis not present

## 2024-03-20 DIAGNOSIS — E782 Mixed hyperlipidemia: Secondary | ICD-10-CM

## 2024-03-20 DIAGNOSIS — I10 Essential (primary) hypertension: Secondary | ICD-10-CM

## 2024-03-20 DIAGNOSIS — F17210 Nicotine dependence, cigarettes, uncomplicated: Secondary | ICD-10-CM

## 2024-03-20 NOTE — Progress Notes (Signed)
 "  Established Patient Office Visit  Subjective:  Patient ID: Shari Foster, female    DOB: [birth year]  Age: 64 y.o. MRN: [id number]  Chief Complaint  Patient presents with   Follow-up    3 month follow up    Patient comes in for follow up today. Generally feels well but mentions IBS with constipation- Probiotics have helped in the past - advised to try again. Continues to smoke 1ppd , Low dose CT chest will be due in May 2026. Forgot to get fasting labs , will go now. Takes Paxil  regularly - thinks it is helping.     No other concerns at this time.   Past Medical History:  Diagnosis Date   Abnormal mammogram, unspecified    Anxiety    Arthritis    Chronic kidney disease    Depression    Family history of breast cancer    Family history of colon cancer    Generalized anxiety disorder    Generalized anxiety disorder with panic attacks    Hypercalcemia 2004   Hypertension    (pt denies)   IBS (irritable bowel syndrome) 2004   Palpitations    PONV (postoperative nausea and vomiting)    Tobacco abuse    Urinary calculus    Wears contact lenses     Past Surgical History:  Procedure Laterality Date   BILATERAL SALPINGOOPHORECTOMY  2010   bilateral mucinous cystadenomas/ Dr Hazel with [facility]   BREAST BIOPSY Left    neg   CHOLECYSTECTOMY  2006   COLONOSCOPY  08/17/2013   small polyps   COLONOSCOPY N/A 07/12/2023   Procedure: COLONOSCOPY;  Surgeon: Unk Corinn Skiff, MD;  Location: [facility];  Service: Endoscopy;  Laterality: N/A;   COLONOSCOPY WITH PROPOFOL  N/A 12/26/2016   Procedure: COLONOSCOPY WITH PROPOFOL ;  Surgeon: Unk Corinn Skiff, MD;  Location: [facility];  Service: Endoscopy;  Laterality: N/A;   COLPOSCOPY  2013   +HRHPV   LAPAROSCOPIC SUPRACERVICAL HYSTERECTOMY  11/23/2008   Dr Harry   LAPAROSCOPIC TUBAL LIGATION     POLYPECTOMY N/A 07/12/2023   Procedure: POLYPECTOMY, INTESTINE;  Surgeon: Unk Corinn Skiff, MD;   Location: [facility];  Service: Endoscopy;  Laterality: N/A;    Social History   Socioeconomic History   Marital status: Divorced    Spouse name: Not on file   Number of children: 2   Years of education: Not on file   Highest education level: Some college, no degree  Occupational History   Not on file  Tobacco Use   Smoking status: Every Day    Current packs/day: 1.00    Average packs/day: 1 pack/day for 49.0 years (49.0 ttl pk-yrs)    Types: Cigarettes    Start date: 79   Smokeless tobacco: Never   Tobacco comments:    off and on since age 11  Vaping Use   Vaping status: Never Used  Substance and Sexual Activity   Alcohol use: No    Comment: rare   Drug use: No   Sexual activity: Not Currently  Other Topics Concern   Not on file  Social History Narrative   Not on file   Social Drivers of Health   Tobacco Use: High Risk (03/20/2024)   Patient History    Smoking Tobacco Use: Every Day    Smokeless Tobacco Use: Never    Passive Exposure: Not on file  Financial Resource Strain: Not on file  Food Insecurity: Not on file  Transportation  Needs: Not on file  Physical Activity: Not on file  Stress: Not on file  Social Connections: Not on file  Intimate Partner Violence: Not on file  Depression (PHQ2-9): Medium Risk (11/19/2023)   Depression (PHQ2-9)    PHQ-2 Score: 5  Alcohol Screen: Not on file  Housing: Unknown (06/20/2023)   Received from [facility]   Epic    Unable to Pay for Housing in the Last Year: Not on file    Number of Times Moved in the Last Year: Not on file    At any time in the past 12 months, were you homeless or living in a shelter (including now)?: No  Utilities: Not on file  Health Literacy: Not on file    Family History  Problem Relation Age of Onset   Leukemia Cousin 67   Breast cancer Paternal Grandmother 53   Schizophrenia Paternal Grandmother    Diabetes Paternal Grandmother    Colon cancer Maternal Aunt         71s   Leukemia Maternal Grandfather 70   Breast cancer Maternal Grandmother 20   Breast cancer Mother 51   Depression Mother    Anxiety disorder Mother    Diabetes Sister    Anxiety disorder Sister    Depression Sister    Schizophrenia Son    ADD / ADHD Son    Drug abuse Son    Alcohol abuse Son    Schizophrenia Father    Heart attack Father    Stroke Father    Schizophrenia Paternal Uncle    Schizophrenia Brother     Allergies[1]  Show/hide medication list[2]  Review of Systems  Constitutional: Negative.  Negative for chills, fever and malaise/fatigue.  HENT: Negative.  Negative for congestion and sore throat.   Eyes: Negative.  Negative for blurred vision and pain.  Respiratory: Negative.  Negative for cough and shortness of breath.   Cardiovascular: Negative.  Negative for chest pain, palpitations and leg swelling.  Gastrointestinal:  Positive for constipation. Negative for abdominal pain, blood in stool, diarrhea, heartburn, melena, nausea and vomiting.  Genitourinary: Negative.  Negative for dysuria, flank pain, frequency and urgency.  Musculoskeletal: Negative.  Negative for joint pain and myalgias.  Skin: Negative.   Neurological: Negative.  Negative for dizziness, tingling, sensory change, weakness and headaches.  Endo/Heme/Allergies: Negative.   Psychiatric/Behavioral: Negative.  Negative for depression and suicidal ideas. The patient is not nervous/anxious.        Objective:   BP 112/72   Pulse 70   Ht 5' 8.5 (1.74 m)   Wt 190 lb 9.6 oz (86.5 kg)   SpO2 96%   BMI 28.56 kg/m   Vitals:   03/20/24 1518  BP: 112/72  Pulse: 70  Height: 5' 8.5 (1.74 m)  Weight: 190 lb 9.6 oz (86.5 kg)  SpO2: 96%  BMI (Calculated): 28.56    Physical Exam Vitals and nursing note reviewed.  Constitutional:      Appearance: Normal appearance.  HENT:     Head: Normocephalic and atraumatic.     Nose: Nose normal.     Mouth/Throat:     Mouth: Mucous membranes  are moist.     Pharynx: Oropharynx is clear.  Eyes:     Conjunctiva/sclera: Conjunctivae normal.     Pupils: Pupils are equal, round, and reactive to light.  Cardiovascular:     Rate and Rhythm: Normal rate and regular rhythm.     Pulses: Normal pulses.  Heart sounds: Normal heart sounds. No murmur heard. Pulmonary:     Effort: Pulmonary effort is normal.     Breath sounds: Normal breath sounds. No wheezing.  Abdominal:     General: Bowel sounds are normal.     Palpations: Abdomen is soft.     Tenderness: There is no abdominal tenderness. There is no right CVA tenderness or left CVA tenderness.  Musculoskeletal:        General: Normal range of motion.     Cervical back: Normal range of motion.     Right lower leg: No edema.     Left lower leg: No edema.  Skin:    General: Skin is warm and dry.  Neurological:     General: No focal deficit present.     Mental Status: She is alert and oriented to person, place, and time.  Psychiatric:        Mood and Affect: Mood normal.        Behavior: Behavior normal.      No results found for any visits on 03/20/24.  No results found for this or any previous visit (from the past 2160 hours).    Assessment & Plan:  Continue all meds. Smoking cessation addressed again. Check labs today. Problem List Items Addressed This Visit       Cardiovascular and Mediastinum   Essential hypertension, benign - Primary     Other   GAD (generalized anxiety disorder)   Mixed hyperlipidemia   Cigarette nicotine dependence without complication    Return in about 3 months (around 06/18/2024).   Total time spent: 30 minutes. This time includes review of previous notes and results and patient face to face interaction during today's visit.    FERNAND FREDY RAMAN, MD  03/20/2024   This document may have been prepared by [facility] Voice Recognition software and as such may include unintentional dictation errors.     [1]  Allergies Allergen Reactions    Codeine Nausea And Vomiting   Flagyl  [Metronidazole ] Itching  [2]  Outpatient Medications Prior to Visit  Medication Sig   Ascorbic Acid (VITAMIN C) 1000 MG tablet Take 1,000 mg by mouth daily.   atenolol  (TENORMIN ) 50 MG tablet TAKE 1 TABLET(50 MG) BY MOUTH DAILY   cholecalciferol (VITAMIN D3) 25 MCG (1000 UNIT) tablet Take 1,000 Units by mouth daily.   hydrochlorothiazide  (HYDRODIURIL ) 25 MG tablet TAKE 1 TABLET(25 MG) BY MOUTH EVERY MORNING   PARoxetine  (PAXIL ) 40 MG tablet Take 1 tablet (40 mg total) by mouth every morning.   ALPRAZolam  (XANAX ) 0.25 MG tablet Take 1 tablet (0.25 mg total) by mouth at bedtime as needed for anxiety. (Patient not taking: Reported on 03/20/2024)   No facility-administered medications prior to visit.   "

## 2024-03-21 LAB — CBC WITH DIFFERENTIAL/PLATELET
Basophils Absolute: 0 x10E3/uL (ref 0.0–0.2)
Basos: 0 %
EOS (ABSOLUTE): 0.2 x10E3/uL (ref 0.0–0.4)
Eos: 2 %
Hematocrit: 43.4 % (ref 34.0–46.6)
Hemoglobin: 15.3 g/dL (ref 11.1–15.9)
Immature Grans (Abs): 0 x10E3/uL (ref 0.0–0.1)
Immature Granulocytes: 0 %
Lymphocytes Absolute: 3.9 x10E3/uL — ABNORMAL HIGH (ref 0.7–3.1)
Lymphs: 37 %
MCH: 33.1 pg — ABNORMAL HIGH (ref 26.6–33.0)
MCHC: 35.3 g/dL (ref 31.5–35.7)
MCV: 94 fL (ref 79–97)
Monocytes Absolute: 0.8 x10E3/uL (ref 0.1–0.9)
Monocytes: 8 %
Neutrophils Absolute: 5.8 x10E3/uL (ref 1.4–7.0)
Neutrophils: 53 %
Platelets: 239 x10E3/uL (ref 150–450)
RBC: 4.62 x10E6/uL (ref 3.77–5.28)
RDW: 11.6 % — ABNORMAL LOW (ref 11.7–15.4)
WBC: 10.8 x10E3/uL (ref 3.4–10.8)

## 2024-03-21 LAB — CMP14+EGFR
ALT: 17 IU/L (ref 0–32)
AST: 19 IU/L (ref 0–40)
Albumin: 4.5 g/dL (ref 3.9–4.9)
Alkaline Phosphatase: 61 IU/L (ref 49–135)
BUN/Creatinine Ratio: 17 (ref 12–28)
BUN: 15 mg/dL (ref 8–27)
Bilirubin Total: 0.4 mg/dL (ref 0.0–1.2)
CO2: 26 mmol/L (ref 20–29)
Calcium: 9.6 mg/dL (ref 8.7–10.3)
Chloride: 101 mmol/L (ref 96–106)
Creatinine, Ser: 0.87 mg/dL (ref 0.57–1.00)
Globulin, Total: 2 g/dL (ref 1.5–4.5)
Glucose: 95 mg/dL (ref 70–99)
Potassium: 3.7 mmol/L (ref 3.5–5.2)
Sodium: 143 mmol/L (ref 134–144)
Total Protein: 6.5 g/dL (ref 6.0–8.5)
eGFR: 76 mL/min/1.73

## 2024-03-21 LAB — LIPID PANEL W/O CHOL/HDL RATIO
Cholesterol, Total: 165 mg/dL (ref 100–199)
HDL: 41 mg/dL
LDL Chol Calc (NIH): 98 mg/dL (ref 0–99)
Triglycerides: 147 mg/dL (ref 0–149)
VLDL Cholesterol Cal: 26 mg/dL (ref 5–40)

## 2024-03-23 NOTE — Progress Notes (Signed)
 Patient notified

## 2024-06-18 ENCOUNTER — Ambulatory Visit: Admitting: Internal Medicine
